# Patient Record
Sex: Female | Born: 2000
Health system: Southern US, Community
[De-identification: ages and names within clinical notes are randomized; demographics above are authoritative.]

## PROBLEM LIST (undated history)

## (undated) ENCOUNTER — Inpatient Hospital Stay (HOSPITAL_COMMUNITY): Payer: Self-pay

## (undated) DIAGNOSIS — Z789 Other specified health status: Secondary | ICD-10-CM

## (undated) DIAGNOSIS — N62 Hypertrophy of breast: Secondary | ICD-10-CM

## (undated) HISTORY — PX: WISDOM TOOTH EXTRACTION: SHX21

---
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
## 2018-01-02 MED FILL — FEMYNOR 0.25-35 MG-MCG TABS: 0.25-35 | 84 days supply | Qty: 84 | Fill #2

## 2018-03-20 MED FILL — FEMYNOR 0.25-35 MG-MCG TABS: 0.25-35 | 84 days supply | Qty: 84 | Fill #0

## 2018-04-15 LAB — HM PAP SMEAR

## 2018-04-21 DIAGNOSIS — E669 Obesity, unspecified: Secondary | ICD-10-CM | POA: Diagnosis not present

## 2018-04-21 DIAGNOSIS — Z23 Encounter for immunization: Secondary | ICD-10-CM | POA: Diagnosis not present

## 2018-04-21 DIAGNOSIS — Z113 Encounter for screening for infections with a predominantly sexual mode of transmission: Secondary | ICD-10-CM | POA: Diagnosis not present

## 2018-04-21 DIAGNOSIS — Z3041 Encounter for surveillance of contraceptive pills: Secondary | ICD-10-CM | POA: Diagnosis not present

## 2018-04-21 DIAGNOSIS — Z01419 Encounter for gynecological examination (general) (routine) without abnormal findings: Secondary | ICD-10-CM | POA: Diagnosis not present

## 2018-04-21 DIAGNOSIS — R635 Abnormal weight gain: Secondary | ICD-10-CM | POA: Diagnosis not present

## 2018-04-21 DIAGNOSIS — Z68.41 Body mass index (BMI) pediatric, greater than or equal to 95th percentile for age: Secondary | ICD-10-CM | POA: Diagnosis not present

## 2018-04-21 DIAGNOSIS — N6320 Unspecified lump in the left breast, unspecified quadrant: Secondary | ICD-10-CM | POA: Diagnosis not present

## 2018-04-22 ENCOUNTER — Other Ambulatory Visit: Payer: Self-pay | Admitting: Obstetrics and Gynecology

## 2018-04-22 DIAGNOSIS — N6320 Unspecified lump in the left breast, unspecified quadrant: Secondary | ICD-10-CM

## 2018-05-01 ENCOUNTER — Other Ambulatory Visit: Payer: Self-pay | Admitting: Obstetrics and Gynecology

## 2018-05-01 ENCOUNTER — Ambulatory Visit
Admission: RE | Admit: 2018-05-01 | Discharge: 2018-05-01 | Disposition: A | Discharge status: Home / Self Care | Payer: 59 | Source: Ambulatory Visit | Attending: Obstetrics and Gynecology | Admitting: Obstetrics and Gynecology

## 2018-05-01 DIAGNOSIS — N6320 Unspecified lump in the left breast, unspecified quadrant: Secondary | ICD-10-CM

## 2018-05-01 DIAGNOSIS — N6324 Unspecified lump in the left breast, lower inner quadrant: Secondary | ICD-10-CM | POA: Diagnosis not present

## 2018-06-23 DIAGNOSIS — Z23 Encounter for immunization: Secondary | ICD-10-CM | POA: Diagnosis not present

## 2018-06-23 MED FILL — FEMYNOR 0.25-35 MG-MCG TABS: 0.25-35 | 84 days supply | Qty: 84 | Fill #0

## 2018-09-07 MED FILL — FEMYNOR 0.25-35 MG-MCG TABS: 0.25-35 | 84 days supply | Qty: 84 | Fill #1

## 2018-09-18 ENCOUNTER — Telehealth: Payer: Self-pay | Admitting: General Practice

## 2018-09-18 NOTE — Telephone Encounter (Signed)
Yes, I will see her 

## 2018-09-18 NOTE — Telephone Encounter (Signed)
Appointment has been made for 12/14/18 & new patient paperwork has been mailed.

## 2018-09-18 NOTE — Telephone Encounter (Signed)
Copied from [place] [id number]. Topic: General - Other >> Sep 18, 2018 10:03 AM Sheran Luz wrote: Reason for CRM: Carmelina Noun, pts mother, called inquiring if Dr. Ronnald Ramp would accept Amica as a new patient. Charlena Cross and her son Emiliana Blaize are current patients of Dr. Ronnald Ramp. Charlena Cross is requesting call back.   Please advise, Thank you.

## 2018-10-23 DIAGNOSIS — Z23 Encounter for immunization: Secondary | ICD-10-CM | POA: Diagnosis not present

## 2018-11-02 ENCOUNTER — Other Ambulatory Visit: Payer: 59

## 2018-11-30 MED FILL — FEMYNOR 0.25-35 MG-MCG TABS: 0.25-35 | 84 days supply | Qty: 84 | Fill #2

## 2018-12-14 ENCOUNTER — Other Ambulatory Visit (INDEPENDENT_AMBULATORY_CARE_PROVIDER_SITE_OTHER): Payer: No Typology Code available for payment source

## 2018-12-14 ENCOUNTER — Encounter (INDEPENDENT_AMBULATORY_CARE_PROVIDER_SITE_OTHER): Payer: Self-pay

## 2018-12-14 ENCOUNTER — Encounter: Payer: Self-pay | Admitting: Internal Medicine

## 2018-12-14 ENCOUNTER — Ambulatory Visit (INDEPENDENT_AMBULATORY_CARE_PROVIDER_SITE_OTHER): Payer: No Typology Code available for payment source | Admitting: Internal Medicine

## 2018-12-14 VITALS — BP 110/70 | HR 85 | Temp 98.0°F | Resp 16 | Ht 64.0 in | Wt 185.5 lb

## 2018-12-14 DIAGNOSIS — N62 Hypertrophy of breast: Secondary | ICD-10-CM | POA: Diagnosis not present

## 2018-12-14 DIAGNOSIS — E669 Obesity, unspecified: Secondary | ICD-10-CM

## 2018-12-14 DIAGNOSIS — Z23 Encounter for immunization: Secondary | ICD-10-CM

## 2018-12-14 DIAGNOSIS — Z113 Encounter for screening for infections with a predominantly sexual mode of transmission: Secondary | ICD-10-CM

## 2018-12-14 DIAGNOSIS — Z0000 Encounter for general adult medical examination without abnormal findings: Secondary | ICD-10-CM

## 2018-12-14 LAB — CBC WITH DIFFERENTIAL/PLATELET
Basophils Absolute: 0 10*3/uL (ref 0.0–0.1)
Basophils Relative: 0.2 % (ref 0.0–3.0)
Eosinophils Absolute: 0.1 10*3/uL (ref 0.0–0.7)
Eosinophils Relative: 1.3 % (ref 0.0–5.0)
HCT: 36.2 % (ref 36.0–49.0)
Hemoglobin: 12.2 g/dL (ref 12.0–16.0)
Lymphocytes Relative: 23.7 % — ABNORMAL LOW (ref 24.0–48.0)
Lymphs Abs: 2.1 10*3/uL (ref 0.7–4.0)
MCHC: 33.7 g/dL (ref 31.0–37.0)
MCV: 85.6 fl (ref 78.0–98.0)
Monocytes Absolute: 1.1 10*3/uL — ABNORMAL HIGH (ref 0.1–1.0)
Monocytes Relative: 12.3 % — ABNORMAL HIGH (ref 3.0–12.0)
Neutro Abs: 5.6 10*3/uL (ref 1.4–7.7)
Neutrophils Relative %: 62.5 % (ref 43.0–71.0)
Platelets: 329 10*3/uL (ref 150.0–575.0)
RBC: 4.24 Mil/uL (ref 3.80–5.70)
RDW: 13.7 % (ref 11.4–15.5)
WBC: 8.9 10*3/uL (ref 4.5–13.5)

## 2018-12-14 LAB — COMPREHENSIVE METABOLIC PANEL
ALT: 13 U/L (ref 0–35)
AST: 14 U/L (ref 0–37)
Albumin: 3.9 g/dL (ref 3.5–5.2)
Alkaline Phosphatase: 71 U/L (ref 47–119)
BUN: 11 mg/dL (ref 6–23)
CO2: 26 mEq/L (ref 19–32)
Calcium: 9.8 mg/dL (ref 8.4–10.5)
Chloride: 104 mEq/L (ref 96–112)
Creatinine, Ser: 0.79 mg/dL (ref 0.40–1.20)
GFR: 119.98 mL/min (ref >60.00)
Glucose, Bld: 77 mg/dL (ref 70–99)
Potassium: 4.1 mEq/L (ref 3.5–5.1)
Sodium: 137 mEq/L (ref 135–145)
Total Bilirubin: 0.3 mg/dL (ref 0.2–1.2)
Total Protein: 7.3 g/dL (ref 6.0–8.3)

## 2018-12-14 LAB — LIPID PANEL
Cholesterol: 212 mg/dL — ABNORMAL HIGH (ref 0–200)
HDL: 49.4 mg/dL (ref >39.00)
LDL Cholesterol: 149 mg/dL — ABNORMAL HIGH (ref 0–99)
NonHDL: 162.95
Total CHOL/HDL Ratio: 4
Triglycerides: 69 mg/dL (ref 0.0–149.0)
VLDL: 13.8 mg/dL (ref 0.0–40.0)

## 2018-12-14 LAB — HEMOGLOBIN A1C: Hgb A1c MFr Bld: 5.9 % (ref 4.6–6.5)

## 2018-12-14 LAB — TSH: TSH: 0.58 u[IU]/mL (ref 0.40–5.00)

## 2018-12-14 NOTE — Patient Instructions (Signed)
Preventive Care 18-39 Years, Female Preventive care refers to lifestyle choices and visits with your health care provider that can promote health and wellness. What does preventive care include?   A yearly physical exam. This is also called an annual well check.  Dental exams once or twice a year.  Routine eye exams. Ask your health care provider how often you should have your eyes checked.  Personal lifestyle choices, including: ? Daily care of your teeth and gums. ? Regular physical activity. ? Eating a healthy diet. ? Avoiding tobacco and drug use. ? Limiting alcohol use. ? Practicing safe sex. ? Taking vitamin and mineral supplements as recommended by your health care provider. What happens during an annual well check? The services and screenings done by your health care provider during your annual well check will depend on your age, overall health, lifestyle risk factors, and family history of disease. Counseling Your health care provider may ask you questions about your:  Alcohol use.  Tobacco use.  Drug use.  Emotional well-being.  Home and relationship well-being.  Sexual activity.  Eating habits.  Work and work environment.  Method of birth control.  Menstrual cycle.  Pregnancy history. Screening You may have the following tests or measurements:  Height, weight, and BMI.  Diabetes screening. This is done by checking your blood sugar (glucose) after you have not eaten for a while (fasting).  Blood pressure.  Lipid and cholesterol levels. These may be checked every 5 years starting at age 20.  Skin check.  Hepatitis C blood test.  Hepatitis B blood test.  Sexually transmitted disease (STD) testing.  BRCA-related cancer screening. This may be done if you have a family history of breast, ovarian, tubal, or peritoneal cancers.  Pelvic exam and Pap test. This may be done every 3 years starting at age 21. Starting at age 30, this may be done every 5  years if you have a Pap test in combination with an HPV test. Discuss your test results, treatment options, and if necessary, the need for more tests with your health care provider. Vaccines Your health care provider may recommend certain vaccines, such as:  Influenza vaccine. This is recommended every year.  Tetanus, diphtheria, and acellular pertussis (Tdap, Td) vaccine. You may need a Td booster every 10 years.  Varicella vaccine. You may need this if you have not been vaccinated.  HPV vaccine. If you are 26 or younger, you may need three doses over 6 months.  Measles, mumps, and rubella (MMR) vaccine. You may need at least one dose of MMR. You may also need a second dose.  Pneumococcal 13-valent conjugate (PCV13) vaccine. You may need this if you have certain conditions and were not previously vaccinated.  Pneumococcal polysaccharide (PPSV23) vaccine. You may need one or two doses if you smoke cigarettes or if you have certain conditions.  Meningococcal vaccine. One dose is recommended if you are age 19-21 years and a first-year college student living in a residence hall, or if you have one of several medical conditions. You may also need additional booster doses.  Hepatitis A vaccine. You may need this if you have certain conditions or if you travel or work in places where you may be exposed to hepatitis A.  Hepatitis B vaccine. You may need this if you have certain conditions or if you travel or work in places where you may be exposed to hepatitis B.  Haemophilus influenzae type b (Hib) vaccine. You may need this if you   have certain risk factors. Talk to your health care provider about which screenings and vaccines you need and how often you need them. This information is not intended to replace advice given to you by your health care provider. Make sure you discuss any questions you have with your health care provider. Document Released: 01/14/2002 Document Revised: 07/01/2017  Document Reviewed: 09/19/2015 Elsevier Interactive Patient Education  2019 [employer].

## 2018-12-14 NOTE — Progress Notes (Signed)
Subjective:  Patient ID: Gabriella Phillips, female    DOB: 10-10-2001  Age: 18 y.o. MRN: [id number]  CC: Annual Exam   HPI [name] presents for a CPX.  She complains that her breasts are too large and feel uncomfortable.  She wants to see a plastic surgeon to consider breast reduction surgery.  She is sexually active and uses an oral contraception to prevent pregnancy and condoms to prevent STDs.  She otherwise feels well today and offers no complaints.  History [place] has no past medical history on file.   She has no past surgical history on file.   Her family history is not on file.She reports that she has never smoked. She has never used smokeless tobacco. She reports that she does not drink alcohol or use drugs.  Outpatient Medications Prior to Visit  Medication Sig Dispense Refill  . norgestimate-ethinyl estradiol (ORTHO-CYCLEN,SPRINTEC,PREVIFEM) 0.25-35 MG-MCG tablet Take 1 tablet by mouth daily.     No facility-administered medications prior to visit.     ROS Review of Systems  Constitutional: Negative.  Negative for diaphoresis and fatigue.  HENT: Negative.   Eyes: Negative.  Negative for visual disturbance.  Respiratory: Negative for apnea, cough, chest tightness, shortness of breath and wheezing.   Cardiovascular: Negative for chest pain, palpitations and leg swelling.  Gastrointestinal: Negative for abdominal pain, constipation, diarrhea, nausea and vomiting.  Endocrine: Negative.   Genitourinary: Negative.   Musculoskeletal: Negative.  Negative for arthralgias, back pain, myalgias and neck pain.  Skin: Negative.  Negative for color change.  Neurological: Negative.  Negative for dizziness, weakness, light-headedness and headaches.  Hematological: Negative for adenopathy. Does not bruise/bleed easily.  Psychiatric/Behavioral: Negative.     Objective:  BP 110/70 (BP Location: Left Arm, Patient Position: Sitting, Cuff Size: Normal)   Pulse 85   Temp 98  F (36.7 C) (Oral)   Resp 16   Ht 5\' 4"  (1.626 m)   Wt 185 lb 8 oz (84.1 kg)   LMP 12/10/2018   SpO2 98%   BMI 31.84 kg/m   Physical Exam Vitals signs reviewed.  Constitutional:      Appearance: She is obese. She is not ill-appearing.  HENT:     Nose: Nose normal. No congestion or rhinorrhea.     Mouth/Throat:     Mouth: Mucous membranes are moist.     Pharynx: Oropharynx is clear. No oropharyngeal exudate or posterior oropharyngeal erythema.  Eyes:     General: No scleral icterus.    Conjunctiva/sclera: Conjunctivae normal.  Neck:     Musculoskeletal: Normal range of motion and neck supple. No muscular tenderness.  Cardiovascular:     Rate and Rhythm: Normal rate and regular rhythm.     Heart sounds: No murmur. No friction rub. No gallop.   Pulmonary:     Effort: Pulmonary effort is normal.     Breath sounds: Normal breath sounds. No wheezing, rhonchi or rales.  Abdominal:     General: Bowel sounds are normal.     Palpations: There is no mass.     Tenderness: There is no abdominal tenderness. There is no guarding.  Genitourinary:    Comments: She deferred on a GU or Pap exam today because another physician did this in May 2019. Musculoskeletal: Normal range of motion.        General: No swelling.     Right lower leg: No edema.     Left lower leg: No edema.  Lymphadenopathy:  Cervical: No cervical adenopathy.  Skin:    General: Skin is warm and dry.  Neurological:     General: No focal deficit present.     Mental Status: She is oriented to person, place, and time. Mental status is at baseline.  Psychiatric:        Mood and Affect: Mood normal.        Behavior: Behavior normal.        Thought Content: Thought content normal.        Judgment: Judgment normal.     Lab Results  Component Value Date   WBC 8.9 12/14/2018   HGB 12.2 12/14/2018   HCT 36.2 12/14/2018   PLT 329.0 12/14/2018   GLUCOSE 77 12/14/2018   CHOL 212 (H) 12/14/2018   TRIG 69.0  12/14/2018   HDL 49.40 12/14/2018   LDLCALC 149 (H) 12/14/2018   ALT 13 12/14/2018   AST 14 12/14/2018   NA 137 12/14/2018   K 4.1 12/14/2018   CL 104 12/14/2018   CREATININE 0.79 12/14/2018   BUN 11 12/14/2018   CO2 26 12/14/2018   TSH 0.58 12/14/2018   HGBA1C 5.9 12/14/2018     Assessment & Plan:   Stephnie was seen today for annual exam.  Diagnoses and all orders for this visit:  Breast hypertrophy in female -     Ambulatory referral to Plastic Surgery  Routine general medical examination at a health care facility- Exam completed, labs reviewed, vaccines reviewed and updated, patient education material was given. -     Lipid panel; Future  Routine screening for STI (sexually transmitted infection) -     Cancel: GC/Chlamydia Probe Amp([facility]) -     Cancel: HIV antibody (with reflex) -     HIV antibody (with reflex); Future -     GC/Chlamydia Probe Amp([facility]); Future  Obesity (BMI 30.0-34.9)- Her BMI is at 32.  Her labs are negative for secondary causes or complications.  She was encouraged to improve her lifestyle modifications. -     CBC with Differential/Platelet; Future -     Hemoglobin A1c; Future -     TSH; Future -     Comprehensive metabolic panel; Future  Need for Tdap vaccination -     Tdap vaccine greater than or equal to 7yo IM  Need for influenza vaccination -     Flu Vaccine QUAD 36+ mos IM   I am having Jinx K. Persichetti maintain her norgestimate-ethinyl estradiol.  No orders of the defined types were placed in this encounter.    Follow-up: Return if symptoms worsen or fail to improve.  Scarlette Calico, MD

## 2018-12-15 LAB — HIV ANTIBODY (ROUTINE TESTING W REFLEX): HIV 1&2 Ab, 4th Generation: NONREACTIVE

## 2018-12-16 ENCOUNTER — Encounter: Payer: Self-pay | Admitting: Internal Medicine

## 2018-12-16 LAB — GC/CHLAMYDIA PROBE AMP
Chlamydia trachomatis, NAA: NEGATIVE
Neisseria gonorrhoeae by PCR: NEGATIVE

## 2019-01-01 ENCOUNTER — Encounter: Payer: Self-pay | Admitting: Internal Medicine

## 2019-01-01 NOTE — Telephone Encounter (Signed)
Pt is requesting the referral for plastic surgery to go to Dr Theodoro Kos [place].   Can this be done or do we need to re enter the referral.

## 2019-01-04 NOTE — Telephone Encounter (Signed)
No need for new referral. I have left a vm at her office to call me back with referral process

## 2019-01-12 ENCOUNTER — Encounter: Payer: Self-pay | Admitting: Plastic Surgery

## 2019-01-12 ENCOUNTER — Ambulatory Visit: Payer: No Typology Code available for payment source | Admitting: Plastic Surgery

## 2019-01-12 VITALS — BP 123/75 | HR 81 | Temp 99.0°F | Ht 64.0 in | Wt 185.0 lb

## 2019-01-12 DIAGNOSIS — G8929 Other chronic pain: Secondary | ICD-10-CM

## 2019-01-12 DIAGNOSIS — M546 Pain in thoracic spine: Secondary | ICD-10-CM

## 2019-01-12 DIAGNOSIS — M542 Cervicalgia: Secondary | ICD-10-CM

## 2019-01-12 DIAGNOSIS — M549 Dorsalgia, unspecified: Secondary | ICD-10-CM | POA: Insufficient documentation

## 2019-01-12 DIAGNOSIS — N62 Hypertrophy of breast: Secondary | ICD-10-CM

## 2019-01-12 NOTE — Progress Notes (Signed)
     Patient ID: Gabriella Phillips, female    DOB: [birth year], 18 y.o.   MRN: [id number]   Chief Complaint  Patient presents with  . Advice Only    for breast reduction    Mammary Hyperplasia: The patient is a 18 y.o. female with a history of mammary hyperplasia for several years.  She has extremely large breasts causing symptoms that include the following: Back pain (upper and lower) and neck pain. She frequently pins bra cups higher on straps for better lift and relief. Notices relief when holding breast up in her hands. Shoulder straps causing grooves, pain occasionally requiring padding. Pain medication is sometimes required with motrin and tylenol.  Activities that are hindered by enlarged breasts include: mammogram May 2019 - fibrosis but otherwise negative.  Her breasts are extremely large and fairly symmetric.  She has hyperpigmentation of the inframammary area on both sides.  The sternal to nipple distance on the right is 34 cm and the left is 34 cm.  The IMF distance is 16 cm.  She is 5 feet 4 inches tall and weighs 185 pounds.  Preoperative bra size = 38 DDD cup.  The estimated excess breast tissue to be removed at the time of surgery = 650 grams on the left and 650 grams on the right.     Review of Systems  Constitutional: Positive for activity change. Negative for appetite change.  HENT: Negative.   Eyes: Negative.   Respiratory: Negative.   Gastrointestinal: Negative.   Endocrine: Negative.   Genitourinary: Negative.   Musculoskeletal: Positive for back pain and neck pain.  Skin: Negative for color change and wound.  Neurological: Negative.   Psychiatric/Behavioral: Negative.     History reviewed. No pertinent past medical history.  History reviewed. No pertinent surgical history.    Current Outpatient Medications:  .  norgestimate-ethinyl estradiol (ORTHO-CYCLEN,SPRINTEC,PREVIFEM) 0.25-35 MG-MCG tablet, Take 1 tablet by mouth daily., Disp: , Rfl:    Objective:    Vitals:   01/12/19 1109  BP: 123/75  Pulse: 81  Temp: 99 F (37.2 C)  SpO2: 100%    Physical Exam Vitals signs and nursing note reviewed.  Constitutional:      Appearance: Normal appearance.  HENT:     Head: Normocephalic and atraumatic.     Nose: Nose normal.     Mouth/Throat:     Mouth: Mucous membranes are moist.  Eyes:     Extraocular Movements: Extraocular movements intact.  Cardiovascular:     Rate and Rhythm: Normal rate.  Pulmonary:     Effort: Pulmonary effort is normal. No respiratory distress.     Breath sounds: No wheezing.  Abdominal:     General: Abdomen is flat. There is no distension.     Tenderness: There is no abdominal tenderness.  Neurological:     General: No focal deficit present.     Mental Status: She is alert.  Psychiatric:        Mood and Affect: Mood normal.        Thought Content: Thought content normal.        Judgment: Judgment normal.     Assessment & Plan:  Breast hypertrophy in female  Chronic bilateral thoracic back pain  Neck pain  Recommend bilateral breast reduction for removal of excess weight from shoulders and back.  [place], DO

## 2019-02-03 ENCOUNTER — Ambulatory Visit (INDEPENDENT_AMBULATORY_CARE_PROVIDER_SITE_OTHER): Payer: No Typology Code available for payment source | Admitting: Internal Medicine

## 2019-02-03 ENCOUNTER — Encounter: Payer: Self-pay | Admitting: Internal Medicine

## 2019-02-03 VITALS — BP 120/70 | HR 80 | Temp 99.0°F | Ht 64.0 in | Wt 184.0 lb

## 2019-02-03 DIAGNOSIS — M546 Pain in thoracic spine: Secondary | ICD-10-CM

## 2019-02-03 DIAGNOSIS — G8929 Other chronic pain: Secondary | ICD-10-CM

## 2019-02-03 NOTE — Progress Notes (Signed)
Subjective:  Patient ID: Gabriella Phillips, female    DOB: August 29, 2001  Age: 18 y.o. MRN: [id number]  CC: Follow-up   HPI Iylah K Sol presents for f/up - She scheduled this follow-up because she thought she needed a letter to get plastic surgery approved because she suffers from chronic back pain related to large breasts.  Since scheduling this appointment she tells me that she has been approved for the surgery and no longer needs a letter from me.  Therefore, she was not seen today and no charge will be entered.  Outpatient Medications Prior to Visit  Medication Sig Dispense Refill  . norgestimate-ethinyl estradiol (ORTHO-CYCLEN,SPRINTEC,PREVIFEM) 0.25-35 MG-MCG tablet Take 1 tablet by mouth daily.     No facility-administered medications prior to visit.     ROS Review of Systems  Objective:  BP 120/70 (BP Location: Left Arm, Patient Position: Sitting, Cuff Size: Normal)   Pulse 80   Temp 99 F (37.2 C) (Oral)   Ht 5\' 4"  (1.626 m)   Wt 184 lb (83.5 kg)   LMP 01/06/2019 (Approximate)   SpO2 99%   BMI 31.58 kg/m   BP Readings from Last 3 Encounters:  02/03/19 120/70  01/12/19 123/75  12/14/18 110/70    Wt Readings from Last 3 Encounters:  02/03/19 184 lb (83.5 kg) (95 %, Z= 1.68)*  01/12/19 185 lb (83.9 kg) (96 %, Z= 1.70)*  12/14/18 185 lb 8 oz (84.1 kg) (96 %, Z= 1.71)*   * Growth percentiles are based on CDC (Girls, 2-20 Years) data.    Physical Exam  Lab Results  Component Value Date   WBC 8.9 12/14/2018   HGB 12.2 12/14/2018   HCT 36.2 12/14/2018   PLT 329.0 12/14/2018   GLUCOSE 77 12/14/2018   CHOL 212 (H) 12/14/2018   TRIG 69.0 12/14/2018   HDL 49.40 12/14/2018   LDLCALC 149 (H) 12/14/2018   ALT 13 12/14/2018   AST 14 12/14/2018   NA 137 12/14/2018   K 4.1 12/14/2018   CL 104 12/14/2018   CREATININE 0.79 12/14/2018   BUN 11 12/14/2018   CO2 26 12/14/2018   TSH 0.58 12/14/2018   HGBA1C 5.9 12/14/2018    US Breast Ltd Uni Left Inc  Axilla  Result Date: 05/01/2018 CLINICAL DATA:  18 year old patient has palpated a lump in the 7 o'clock region of the left breast for approximately 6-7 months. EXAM: ULTRASOUND OF THE LEFT BREAST COMPARISON:  None FINDINGS: On physical exam, there is a smooth, oval, and mobile mass measuring approximately 3.5 cm at 7 o'clock position 2 cm from the nipple. Targeted ultrasound is performed, showing a homogeneously hypoechoic circumscribed oval mass parallel to the chest wall 7 o'clock position 2 cm from the nipple. This mass measures 4.1 x 2.4 x 4.4 cm. Superficial aspect of the mass abuts the dermis. IMPRESSION: Probable fibroadenoma in the left breast 7 o'clock position. RECOMMENDATION: The options of follow-up left breast ultrasounds in 6, 12, and 24 months, versus surgical consultation for possible excision, versus ultrasound-guided core needle biopsy were discussed with the patient. At this point, she prefers imaging follow-up. Left breast ultrasound is recommended in 6 months. I have discussed the findings and recommendations with the patient. Results were also provided in writing at the conclusion of the visit. If applicable, a reminder letter will be sent to the patient regarding the next appointment. BI-RADS CATEGORY  3: Probably benign. Electronically Signed   By: Curlene Dolphin M.D.   On: 05/01/2018 07:49  Assessment & Plan:   There are no diagnoses linked to this encounter. I am having Akeila K. Friley maintain her norgestimate-ethinyl estradiol.  No orders of the defined types were placed in this encounter.    Follow-up: No follow-ups on file.  Scarlette Calico, MD

## 2019-02-17 ENCOUNTER — Telehealth: Payer: Self-pay

## 2019-02-17 DIAGNOSIS — N6320 Unspecified lump in the left breast, unspecified quadrant: Secondary | ICD-10-CM

## 2019-02-17 MED FILL — FEMYNOR 0.25-35 MG-MCG TABS: 0.25-35 | 84 days supply | Qty: 84 | Fill #3

## 2019-02-17 NOTE — Telephone Encounter (Signed)
Per Dr. Eusebio Friendly order- pt will need to have a breast ultrasound prior to surgery on 04/14/2019 Call to pt to confirm that she would like to return to [facility] for this study. I will forward this order to them  [facility]

## 2019-02-17 NOTE — Telephone Encounter (Signed)
-----   Message from [place] sent at 02/10/2019  2:43 PM EDT ----- Regarding: breast ultrasound Can you please put an order in for a breast ultrasound, if a current one is needed. I only see the [place] from 2019. Thank you!

## 2019-03-03 ENCOUNTER — Other Ambulatory Visit: Payer: Self-pay

## 2019-03-03 ENCOUNTER — Other Ambulatory Visit: Payer: Self-pay | Admitting: Plastic Surgery

## 2019-03-03 ENCOUNTER — Ambulatory Visit
Admission: RE | Admit: 2019-03-03 | Discharge: 2019-03-03 | Disposition: A | Discharge status: Home / Self Care | Payer: No Typology Code available for payment source | Source: Ambulatory Visit | Attending: Plastic Surgery | Admitting: Plastic Surgery

## 2019-03-03 DIAGNOSIS — N6320 Unspecified lump in the left breast, unspecified quadrant: Secondary | ICD-10-CM

## 2019-03-04 ENCOUNTER — Ambulatory Visit
Admission: RE | Admit: 2019-03-04 | Discharge: 2019-03-04 | Disposition: A | Discharge status: Home / Self Care | Payer: No Typology Code available for payment source | Source: Ambulatory Visit | Attending: Plastic Surgery | Admitting: Plastic Surgery

## 2019-03-04 DIAGNOSIS — N6320 Unspecified lump in the left breast, unspecified quadrant: Secondary | ICD-10-CM

## 2019-03-30 ENCOUNTER — Encounter: Payer: No Typology Code available for payment source | Admitting: Plastic Surgery

## 2019-04-04 ENCOUNTER — Encounter: Payer: Self-pay | Admitting: Internal Medicine

## 2019-04-23 ENCOUNTER — Encounter: Payer: No Typology Code available for payment source | Admitting: Plastic Surgery

## 2019-04-30 ENCOUNTER — Telehealth: Payer: Self-pay | Admitting: Plastic Surgery

## 2019-04-30 NOTE — Telephone Encounter (Signed)
Called patient to confirm appointment scheduled for Monday. Patient answered the following questions: 1. To the best of your knowledge, have you been in close contact with any one with a confirmed diagnosis of COVID-19? No 2. Have you had any one or more of the following; fever, chills, cough, shortness of breath, or any flu-like symptoms? No 3. Have you been diagnosed with or have a previous diagnosis of COVID 19? No 4. I am going to go over a few other symptoms with you. Please let me know if you are experiencing any of the following: None of the below a. Ear, nose, or throat discomfort b. A sore throat c. Headache d. Muscle pain e. Diarrhea f. Loss of taste or smell

## 2019-05-03 ENCOUNTER — Other Ambulatory Visit: Payer: Self-pay

## 2019-05-03 ENCOUNTER — Encounter: Payer: Self-pay | Admitting: Plastic Surgery

## 2019-05-03 ENCOUNTER — Ambulatory Visit (INDEPENDENT_AMBULATORY_CARE_PROVIDER_SITE_OTHER): Payer: No Typology Code available for payment source | Admitting: Plastic Surgery

## 2019-05-03 VITALS — BP 119/76 | HR 83 | Temp 98.7°F | Ht 65.0 in | Wt 190.4 lb

## 2019-05-03 DIAGNOSIS — M542 Cervicalgia: Secondary | ICD-10-CM

## 2019-05-03 DIAGNOSIS — M546 Pain in thoracic spine: Secondary | ICD-10-CM

## 2019-05-03 DIAGNOSIS — N62 Hypertrophy of breast: Secondary | ICD-10-CM

## 2019-05-03 DIAGNOSIS — G8929 Other chronic pain: Secondary | ICD-10-CM

## 2019-05-03 DIAGNOSIS — E669 Obesity, unspecified: Secondary | ICD-10-CM

## 2019-05-03 MED ORDER — CEPHALEXIN 500 MG PO CAPS: 500.0000 mg | ORAL_CAPSULE | Freq: Four times a day (QID) | ORAL | 0 refills | Status: AC

## 2019-05-03 MED ORDER — HYDROCODONE-ACETAMINOPHEN 5-325 MG PO TABS: 1.0000 | ORAL_TABLET | Freq: Three times a day (TID) | ORAL | 0 refills | Status: AC | PRN

## 2019-05-03 MED ORDER — ONDANSETRON HCL 4 MG PO TABS: 4.0000 mg | ORAL_TABLET | Freq: Three times a day (TID) | ORAL | 0 refills | Status: AC | PRN

## 2019-05-03 MED FILL — HYDROCODON-APAP 5-325: 5-325 | 7 days supply | Qty: 20 | Fill #0

## 2019-05-03 MED FILL — CEPHALEXIN 500 MG CAPSULE: 500 | 5 days supply | Qty: 20 | Fill #0

## 2019-05-03 MED FILL — ONDANSETRON HCL 4 MG TABLET: 4 | 5 days supply | Qty: 15 | Fill #0

## 2019-05-03 NOTE — Progress Notes (Signed)
Patient ID: Gabriella Phillips, female    DOB: March 13, 2001, 18 y.o.   MRN: [id number]   Chief Complaint  Patient presents with  . Breast Problem    The patient is a 18 year old female here for her preoperative history and physical for bilateral breast reduction.  She has complaints of neck and back pain.  She is a 38 DDD bra size.  She is 5 feet 5 inches tall and weighs 190 pounds.  She has not had any recent illnesses. She has been staying at home with the Covid-19 crisis. We have estimated a 650 gm removal from each side.   Review of Systems  Constitutional: Negative.  Negative for activity change and appetite change.  HENT: Negative.   Eyes: Negative.   Respiratory: Negative.  Negative for chest tightness and shortness of breath.   Cardiovascular: Negative.  Negative for leg swelling.  Gastrointestinal: Negative.  Negative for abdominal distention and abdominal pain.  Endocrine: Negative.   Genitourinary: Negative.   Musculoskeletal: Positive for back pain and neck pain.  Skin: Negative.   Psychiatric/Behavioral: Negative.     History reviewed. No pertinent past medical history.  History reviewed. No pertinent surgical history.    Current Outpatient Medications:  .  norgestimate-ethinyl estradiol (ORTHO-CYCLEN,SPRINTEC,PREVIFEM) 0.25-35 MG-MCG tablet, Take 1 tablet by mouth daily., Disp: , Rfl:    Objective:   Vitals:   05/03/19 0834  BP: 119/76  Pulse: 83  Temp: 98.7 F (37.1 C)  SpO2: 97%    Physical Exam Vitals signs and nursing note reviewed.  Constitutional:      Appearance: Normal appearance.  HENT:     Head: Normocephalic and atraumatic.  Neck:     Musculoskeletal: Normal range of motion.  Cardiovascular:     Rate and Rhythm: Normal rate.     Pulses: Normal pulses.  Pulmonary:     Effort: Pulmonary effort is normal. No respiratory distress.     Breath sounds: No wheezing.  Abdominal:     General: Abdomen is flat. There is no distension.   Tenderness: There is no abdominal tenderness.  Musculoskeletal:        General: No swelling.  Skin:    General: Skin is warm.  Neurological:     General: No focal deficit present.     Mental Status: She is alert and oriented to person, place, and time.  Psychiatric:        Mood and Affect: Mood normal.        Behavior: Behavior normal.     Assessment & Plan:  Neck pain  Obesity (BMI 30.0-34.9)  Chronic bilateral thoracic back pain  Symptomatic mammary hypertrophy Plan for bilateral breast reduction.  The risk that can be encountered with breast reduction were discussed and include the following but not limited to these:  Breast asymmetry, fluid accumulation, firmness of the breast, inability to breast feed, loss of nipple or areola, skin loss, decrease or no nipple sensation, fat necrosis of the breast tissue, bleeding, infection, healing delay.  There are risks of anesthesia, changes to skin sensation and injury to nerves or blood vessels.  The muscle can be temporarily or permanently injured.  You may have an allergic reaction to tape, suture, glue, blood products which can result in skin discoloration, swelling, pain, skin lesions, poor healing.  Any of these can lead to the need for revisonal surgery or stage procedures.  A reduction has potential to interfere with diagnostic procedures.  Nipple or breast piercing can  increase risks of infection.  This procedure is best done when the breast is fully developed.  Changes in the breast will continue to occur over time.  Pregnancy can alter the outcomes of previous breast reduction surgery, weight gain and weigh loss can also effect the long term appearance.   Prescriptions sent to pharmacy.  [place], DO

## 2019-05-03 NOTE — H&P (View-Only) (Signed)
Patient ID: Gabriella Phillips, female    DOB: 04/02/2001, 18 y.o.   MRN: [id number]   Chief Complaint  Patient presents with  . Breast Problem    The patient is a 18 year old female here for her preoperative history and physical for bilateral breast reduction.  She has complaints of neck and back pain.  She is a 38 DDD bra size.  She is 5 feet 5 inches tall and weighs 190 pounds.  She has not had any recent illnesses. She has been staying at home with the Covid-19 crisis. We have estimated a 650 gm removal from each side.   Review of Systems  Constitutional: Negative.  Negative for activity change and appetite change.  HENT: Negative.   Eyes: Negative.   Respiratory: Negative.  Negative for chest tightness and shortness of breath.   Cardiovascular: Negative.  Negative for leg swelling.  Gastrointestinal: Negative.  Negative for abdominal distention and abdominal pain.  Endocrine: Negative.   Genitourinary: Negative.   Musculoskeletal: Positive for back pain and neck pain.  Skin: Negative.   Psychiatric/Behavioral: Negative.     History reviewed. No pertinent past medical history.  History reviewed. No pertinent surgical history.    Current Outpatient Medications:  .  norgestimate-ethinyl estradiol (ORTHO-CYCLEN,SPRINTEC,PREVIFEM) 0.25-35 MG-MCG tablet, Take 1 tablet by mouth daily., Disp: , Rfl:    Objective:   Vitals:   05/03/19 0834  BP: 119/76  Pulse: 83  Temp: 98.7 F (37.1 C)  SpO2: 97%    Physical Exam Vitals signs and nursing note reviewed.  Constitutional:      Appearance: Normal appearance.  HENT:     Head: Normocephalic and atraumatic.  Neck:     Musculoskeletal: Normal range of motion.  Cardiovascular:     Rate and Rhythm: Normal rate.     Pulses: Normal pulses.  Pulmonary:     Effort: Pulmonary effort is normal. No respiratory distress.     Breath sounds: No wheezing.  Abdominal:     General: Abdomen is flat. There is no distension.   Tenderness: There is no abdominal tenderness.  Musculoskeletal:        General: No swelling.  Skin:    General: Skin is warm.  Neurological:     General: No focal deficit present.     Mental Status: She is alert and oriented to person, place, and time.  Psychiatric:        Mood and Affect: Mood normal.        Behavior: Behavior normal.     Assessment & Plan:  Neck pain  Obesity (BMI 30.0-34.9)  Chronic bilateral thoracic back pain  Symptomatic mammary hypertrophy Plan for bilateral breast reduction.  The risk that can be encountered with breast reduction were discussed and include the following but not limited to these:  Breast asymmetry, fluid accumulation, firmness of the breast, inability to breast feed, loss of nipple or areola, skin loss, decrease or no nipple sensation, fat necrosis of the breast tissue, bleeding, infection, healing delay.  There are risks of anesthesia, changes to skin sensation and injury to nerves or blood vessels.  The muscle can be temporarily or permanently injured.  You may have an allergic reaction to tape, suture, glue, blood products which can result in skin discoloration, swelling, pain, skin lesions, poor healing.  Any of these can lead to the need for revisonal surgery or stage procedures.  A reduction has potential to interfere with diagnostic procedures.  Nipple or breast piercing can  increase risks of infection.  This procedure is best done when the breast is fully developed.  Changes in the breast will continue to occur over time.  Pregnancy can alter the outcomes of previous breast reduction surgery, weight gain and weigh loss can also effect the long term appearance.   Prescriptions sent to pharmacy.  [place], DO

## 2019-05-04 ENCOUNTER — Encounter: Payer: No Typology Code available for payment source | Admitting: Plastic Surgery

## 2019-05-05 ENCOUNTER — Encounter (HOSPITAL_BASED_OUTPATIENT_CLINIC_OR_DEPARTMENT_OTHER): Payer: Self-pay | Admitting: *Deleted

## 2019-05-05 ENCOUNTER — Other Ambulatory Visit: Payer: Self-pay

## 2019-05-10 ENCOUNTER — Other Ambulatory Visit (HOSPITAL_COMMUNITY)
Admission: RE | Admit: 2019-05-10 | Discharge: 2019-05-10 | Disposition: A | Discharge status: Home / Self Care | Payer: No Typology Code available for payment source | Source: Ambulatory Visit | Attending: Plastic Surgery | Admitting: Plastic Surgery

## 2019-05-10 DIAGNOSIS — Z1159 Encounter for screening for other viral diseases: Secondary | ICD-10-CM | POA: Insufficient documentation

## 2019-05-10 LAB — SARS CORONAVIRUS 2 BY RT PCR (HOSPITAL ORDER, PERFORMED IN ~~LOC~~ HOSPITAL LAB): SARS Coronavirus 2: NEGATIVE

## 2019-05-12 ENCOUNTER — Ambulatory Visit (HOSPITAL_BASED_OUTPATIENT_CLINIC_OR_DEPARTMENT_OTHER)
Admission: RE | Admit: 2019-05-12 | Discharge: 2019-05-12 | Disposition: A | Discharge status: Home / Self Care | Payer: No Typology Code available for payment source | Attending: Plastic Surgery | Admitting: Plastic Surgery

## 2019-05-12 ENCOUNTER — Ambulatory Visit (HOSPITAL_BASED_OUTPATIENT_CLINIC_OR_DEPARTMENT_OTHER): Payer: No Typology Code available for payment source | Admitting: Anesthesiology

## 2019-05-12 ENCOUNTER — Encounter (HOSPITAL_BASED_OUTPATIENT_CLINIC_OR_DEPARTMENT_OTHER)
Admission: RE | Disposition: A | Discharge status: Home / Self Care | Payer: Self-pay | Source: Home / Self Care | Attending: Plastic Surgery

## 2019-05-12 ENCOUNTER — Other Ambulatory Visit: Payer: Self-pay

## 2019-05-12 ENCOUNTER — Encounter (HOSPITAL_BASED_OUTPATIENT_CLINIC_OR_DEPARTMENT_OTHER): Payer: Self-pay

## 2019-05-12 DIAGNOSIS — Z793 Long term (current) use of hormonal contraceptives: Secondary | ICD-10-CM | POA: Insufficient documentation

## 2019-05-12 DIAGNOSIS — D360 Benign neoplasm of lymph nodes: Secondary | ICD-10-CM | POA: Diagnosis not present

## 2019-05-12 DIAGNOSIS — Z68.41 Body mass index (BMI) pediatric, greater than or equal to 95th percentile for age: Secondary | ICD-10-CM | POA: Diagnosis not present

## 2019-05-12 DIAGNOSIS — N62 Hypertrophy of breast: Secondary | ICD-10-CM

## 2019-05-12 DIAGNOSIS — D242 Benign neoplasm of left breast: Secondary | ICD-10-CM | POA: Insufficient documentation

## 2019-05-12 DIAGNOSIS — M542 Cervicalgia: Secondary | ICD-10-CM

## 2019-05-12 DIAGNOSIS — M549 Dorsalgia, unspecified: Secondary | ICD-10-CM | POA: Diagnosis not present

## 2019-05-12 HISTORY — PX: BREAST REDUCTION SURGERY: SHX8

## 2019-05-12 HISTORY — DX: Hypertrophy of breast: N62

## 2019-05-12 LAB — POCT PREGNANCY, URINE: Preg Test, Ur: NEGATIVE

## 2019-05-12 SURGERY — MAMMOPLASTY, REDUCTION
Anesthesia: General | Site: Breast | Laterality: Bilateral

## 2019-05-12 MED ORDER — ACETAMINOPHEN 160 MG/5ML PO SOLN: 325.0000 mg | ORAL | Status: DC | PRN

## 2019-05-12 MED ORDER — SUGAMMADEX SODIUM 200 MG/2ML IV SOLN
INTRAVENOUS | Status: DC | PRN
  Administered 2019-05-12: 200 mg via INTRAVENOUS

## 2019-05-12 MED ORDER — SODIUM CHLORIDE 0.9% FLUSH: 3.0000 mL | INTRAVENOUS | Status: DC | PRN

## 2019-05-12 MED ORDER — LIDOCAINE HCL 1 % IJ SOLN
INTRAMUSCULAR | Status: DC | PRN
  Administered 2019-05-12: 50 mL

## 2019-05-12 MED ORDER — CELECOXIB 200 MG PO CAPS
200.0000 mg | ORAL_CAPSULE | Freq: Once | ORAL | Status: AC
  Administered 2019-05-12: 200 mg via ORAL

## 2019-05-12 MED ORDER — SUFENTANIL CITRATE 50 MCG/ML IV SOLN
INTRAVENOUS | Status: DC | PRN
  Administered 2019-05-12 (×2): 10 ug via INTRAVENOUS
  Administered 2019-05-12 (×2): 5 ug via INTRAVENOUS
  Administered 2019-05-12 (×2): 10 ug via INTRAVENOUS

## 2019-05-12 MED ORDER — DEXAMETHASONE SODIUM PHOSPHATE 10 MG/ML IJ SOLN
INTRAMUSCULAR | Status: AC
  Filled 2019-05-12: qty 1

## 2019-05-12 MED ORDER — SCOPOLAMINE 1 MG/3DAYS TD PT72: 1.0000 | MEDICATED_PATCH | Freq: Once | TRANSDERMAL | Status: DC | PRN

## 2019-05-12 MED ORDER — MIDAZOLAM HCL 2 MG/2ML IJ SOLN
INTRAMUSCULAR | Status: AC
  Filled 2019-05-12: qty 2

## 2019-05-12 MED ORDER — ACETAMINOPHEN 650 MG RE SUPP: 650.0000 mg | RECTAL | Status: DC | PRN

## 2019-05-12 MED ORDER — SODIUM CHLORIDE 0.9 % IV SOLN
INTRAVENOUS | Status: DC | PRN
  Administered 2019-05-12: 500 mL

## 2019-05-12 MED ORDER — ONDANSETRON HCL 4 MG/2ML IJ SOLN
INTRAMUSCULAR | Status: AC
  Filled 2019-05-12: qty 2

## 2019-05-12 MED ORDER — SUGAMMADEX SODIUM 500 MG/5ML IV SOLN
INTRAVENOUS | Status: AC
  Filled 2019-05-12: qty 5

## 2019-05-12 MED ORDER — SODIUM CHLORIDE 0.9 % IV SOLN: 250.0000 mL | INTRAVENOUS | Status: DC | PRN

## 2019-05-12 MED ORDER — CELECOXIB 200 MG PO CAPS
ORAL_CAPSULE | ORAL | Status: AC
  Filled 2019-05-12: qty 1

## 2019-05-12 MED ORDER — ROCURONIUM BROMIDE 100 MG/10ML IV SOLN
INTRAVENOUS | Status: DC | PRN
  Administered 2019-05-12: 50 mg via INTRAVENOUS

## 2019-05-12 MED ORDER — MEPERIDINE HCL 25 MG/ML IJ SOLN: 6.2500 mg | INTRAMUSCULAR | Status: DC | PRN

## 2019-05-12 MED ORDER — OXYCODONE HCL 5 MG/5ML PO SOLN: 5.0000 mg | Freq: Once | ORAL | Status: DC | PRN

## 2019-05-12 MED ORDER — SUCCINYLCHOLINE CHLORIDE 200 MG/10ML IV SOSY
PREFILLED_SYRINGE | INTRAVENOUS | Status: AC
  Filled 2019-05-12: qty 10

## 2019-05-12 MED ORDER — EPHEDRINE 5 MG/ML INJ
INTRAVENOUS | Status: AC
  Filled 2019-05-12: qty 10

## 2019-05-12 MED ORDER — ACETAMINOPHEN 325 MG PO TABS: 650.0000 mg | ORAL_TABLET | ORAL | Status: DC | PRN

## 2019-05-12 MED ORDER — SODIUM CHLORIDE 0.9% FLUSH: 3.0000 mL | Freq: Two times a day (BID) | INTRAVENOUS | Status: DC

## 2019-05-12 MED ORDER — PHENYLEPHRINE 40 MCG/ML (10ML) SYRINGE FOR IV PUSH (FOR BLOOD PRESSURE SUPPORT)
PREFILLED_SYRINGE | INTRAVENOUS | Status: AC
  Filled 2019-05-12: qty 10

## 2019-05-12 MED ORDER — FENTANYL CITRATE (PF) 100 MCG/2ML IJ SOLN
INTRAMUSCULAR | Status: AC
  Filled 2019-05-12: qty 2

## 2019-05-12 MED ORDER — LIDOCAINE 2% (20 MG/ML) 5 ML SYRINGE
INTRAMUSCULAR | Status: AC
  Filled 2019-05-12: qty 5

## 2019-05-12 MED ORDER — LACTATED RINGERS IV SOLN
INTRAVENOUS | Status: AC | PRN
  Administered 2019-05-12: 1000 mL
  Administered 2019-05-12: 700 mL

## 2019-05-12 MED ORDER — CEFAZOLIN SODIUM-DEXTROSE 2-4 GM/100ML-% IV SOLN
INTRAVENOUS | Status: AC
  Filled 2019-05-12: qty 100

## 2019-05-12 MED ORDER — FENTANYL CITRATE (PF) 100 MCG/2ML IJ SOLN: 50.0000 ug | INTRAMUSCULAR | Status: DC | PRN

## 2019-05-12 MED ORDER — OXYCODONE HCL 5 MG PO TABS: 5.0000 mg | ORAL_TABLET | ORAL | Status: DC | PRN

## 2019-05-12 MED ORDER — SUFENTANIL CITRATE 50 MCG/ML IV SOLN
INTRAVENOUS | Status: AC
  Filled 2019-05-12: qty 1

## 2019-05-12 MED ORDER — ACETAMINOPHEN 500 MG PO TABS
1000.0000 mg | ORAL_TABLET | Freq: Once | ORAL | Status: AC
  Administered 2019-05-12: 09:00:00 1000 mg via ORAL

## 2019-05-12 MED ORDER — LACTATED RINGERS IV SOLN
INTRAVENOUS | Status: DC
  Administered 2019-05-12 (×3): via INTRAVENOUS

## 2019-05-12 MED ORDER — MIDAZOLAM HCL 2 MG/2ML IJ SOLN
1.0000 mg | INTRAMUSCULAR | Status: DC | PRN
  Administered 2019-05-12: 2 mg via INTRAVENOUS

## 2019-05-12 MED ORDER — ACETAMINOPHEN 500 MG PO TABS
ORAL_TABLET | ORAL | Status: AC
  Filled 2019-05-12: qty 2

## 2019-05-12 MED ORDER — OXYCODONE HCL 5 MG PO TABS: 5.0000 mg | ORAL_TABLET | Freq: Once | ORAL | Status: DC | PRN

## 2019-05-12 MED ORDER — PROPOFOL 500 MG/50ML IV EMUL: INTRAVENOUS | Status: DC | PRN

## 2019-05-12 MED ORDER — PROPOFOL 500 MG/50ML IV EMUL
INTRAVENOUS | Status: DC | PRN
  Administered 2019-05-12: 25 ug/kg/min via INTRAVENOUS

## 2019-05-12 MED ORDER — EPINEPHRINE PF 1 MG/ML IJ SOLN
INTRAMUSCULAR | Status: DC | PRN
  Administered 2019-05-12: 1 mg

## 2019-05-12 MED ORDER — ONDANSETRON HCL 4 MG/2ML IJ SOLN: 4.0000 mg | Freq: Once | INTRAMUSCULAR | Status: DC | PRN

## 2019-05-12 MED ORDER — LIDOCAINE HCL (PF) 1 % IJ SOLN
INTRAMUSCULAR | Status: AC
  Filled 2019-05-12: qty 30

## 2019-05-12 MED ORDER — DEXAMETHASONE SODIUM PHOSPHATE 4 MG/ML IJ SOLN
INTRAMUSCULAR | Status: DC | PRN
  Administered 2019-05-12: 10 mg via INTRAVENOUS

## 2019-05-12 MED ORDER — LIDOCAINE HCL (CARDIAC) PF 100 MG/5ML IV SOSY
PREFILLED_SYRINGE | INTRAVENOUS | Status: DC | PRN
  Administered 2019-05-12: 75 mg via INTRAVENOUS

## 2019-05-12 MED ORDER — ACETAMINOPHEN 325 MG PO TABS: 325.0000 mg | ORAL_TABLET | ORAL | Status: DC | PRN

## 2019-05-12 MED ORDER — FENTANYL CITRATE (PF) 100 MCG/2ML IJ SOLN
25.0000 ug | INTRAMUSCULAR | Status: DC | PRN
  Administered 2019-05-12: 25 ug via INTRAVENOUS

## 2019-05-12 MED ORDER — CEFAZOLIN SODIUM-DEXTROSE 2-4 GM/100ML-% IV SOLN
2.0000 g | INTRAVENOUS | Status: AC
  Administered 2019-05-12: 2 g via INTRAVENOUS

## 2019-05-12 MED ORDER — GABAPENTIN 300 MG PO CAPS
300.0000 mg | ORAL_CAPSULE | Freq: Once | ORAL | Status: AC
  Administered 2019-05-12: 300 mg via ORAL

## 2019-05-12 MED ORDER — GABAPENTIN 300 MG PO CAPS
ORAL_CAPSULE | ORAL | Status: AC
  Filled 2019-05-12: qty 1

## 2019-05-12 SURGICAL SUPPLY — 67 items
ADH SKN CLS APL DERMABOND .7 (GAUZE/BANDAGES/DRESSINGS) ×2
APL PRP STRL LF DISP 70% ISPRP (MISCELLANEOUS) ×1
BAG DECANTER FOR FLEXI CONT (MISCELLANEOUS) ×2 IMPLANT
BINDER BREAST LRG (GAUZE/BANDAGES/DRESSINGS) IMPLANT
BINDER BREAST MEDIUM (GAUZE/BANDAGES/DRESSINGS) IMPLANT
BINDER BREAST XLRG (GAUZE/BANDAGES/DRESSINGS) IMPLANT
BINDER BREAST XXLRG (GAUZE/BANDAGES/DRESSINGS) IMPLANT
BIOPATCH RED 1 DISK 7.0 (GAUZE/BANDAGES/DRESSINGS) IMPLANT
BLADE HEX COATED 2.75 (ELECTRODE) ×2 IMPLANT
BLADE KNIFE PERSONA 10 (BLADE) ×4 IMPLANT
BLADE SURG 15 STRL LF DISP TIS (BLADE) IMPLANT
BLADE SURG 15 STRL SS (BLADE)
BNDG GAUZE ELAST 4 BULKY (GAUZE/BANDAGES/DRESSINGS) ×4 IMPLANT
CANISTER SUCT 1200ML W/VALVE (MISCELLANEOUS) ×2 IMPLANT
CHLORAPREP W/TINT 26 (MISCELLANEOUS) ×2 IMPLANT
COVER BACK TABLE REUSABLE LG (DRAPES) ×2 IMPLANT
COVER MAYO STAND REUSABLE (DRAPES) ×2 IMPLANT
COVER WAND RF STERILE (DRAPES) IMPLANT
DECANTER SPIKE VIAL GLASS SM (MISCELLANEOUS) IMPLANT
DERMABOND ADVANCED (GAUZE/BANDAGES/DRESSINGS) ×2
DERMABOND ADVANCED .7 DNX12 (GAUZE/BANDAGES/DRESSINGS) IMPLANT
DRAIN CHANNEL 19F RND (DRAIN) IMPLANT
DRAPE LAPAROSCOPIC ABDOMINAL (DRAPES) ×2 IMPLANT
DRSG PAD ABDOMINAL 8X10 ST (GAUZE/BANDAGES/DRESSINGS) ×4 IMPLANT
ELECT BLADE 4.0 EZ CLEAN MEGAD (MISCELLANEOUS)
ELECT REM PT RETURN 9FT ADLT (ELECTROSURGICAL) ×2
ELECTRODE BLDE 4.0 EZ CLN MEGD (MISCELLANEOUS) IMPLANT
ELECTRODE REM PT RTRN 9FT ADLT (ELECTROSURGICAL) ×1 IMPLANT
EVACUATOR SILICONE 100CC (DRAIN) IMPLANT
GAUZE SPONGE 4X4 12PLY STRL LF (GAUZE/BANDAGES/DRESSINGS) ×2 IMPLANT
GLOVE BIO SURGEON STRL SZ 6.5 (GLOVE) ×7 IMPLANT
GLOVE BIO SURGEON STRL SZ7 (GLOVE) ×1 IMPLANT
GLOVE BIOGEL PI IND STRL 7.0 (GLOVE) IMPLANT
GLOVE BIOGEL PI INDICATOR 7.0 (GLOVE) ×1
GOWN STRL REUS W/ TWL LRG LVL3 (GOWN DISPOSABLE) ×3 IMPLANT
GOWN STRL REUS W/TWL LRG LVL3 (GOWN DISPOSABLE) ×6
NDL HYPO 25X1 1.5 SAFETY (NEEDLE) ×1 IMPLANT
NDL SAFETY ECLIPSE 18X1.5 (NEEDLE) IMPLANT
NEEDLE HYPO 18GX1.5 SHARP (NEEDLE) ×2
NEEDLE HYPO 25X1 1.5 SAFETY (NEEDLE) ×2 IMPLANT
NS IRRIG 1000ML POUR BTL (IV SOLUTION) IMPLANT
PACK BASIN DAY SURGERY FS (CUSTOM PROCEDURE TRAY) ×2 IMPLANT
PAD ALCOHOL SWAB (MISCELLANEOUS) ×1 IMPLANT
PENCIL BUTTON HOLSTER BLD 10FT (ELECTRODE) ×2 IMPLANT
PIN SAFETY STERILE (MISCELLANEOUS) IMPLANT
SLEEVE SCD COMPRESS KNEE MED (MISCELLANEOUS) ×2 IMPLANT
SPONGE LAP 18X18 RF (DISPOSABLE) ×4 IMPLANT
STRIP SUTURE WOUND CLOSURE 1/2 (SUTURE) ×6 IMPLANT
SUT MNCRL AB 4-0 PS2 18 (SUTURE) ×8 IMPLANT
SUT MON AB 3-0 SH 27 (SUTURE) ×4
SUT MON AB 3-0 SH27 (SUTURE) ×1 IMPLANT
SUT MON AB 5-0 PS2 18 (SUTURE) ×7 IMPLANT
SUT PDS 3-0 CT2 (SUTURE)
SUT PDS AB 2-0 CT2 27 (SUTURE) IMPLANT
SUT PDS II 3-0 CT2 27 ABS (SUTURE) IMPLANT
SUT SILK 3 0 PS 1 (SUTURE) IMPLANT
SYR 3ML 23GX1 SAFETY (SYRINGE) ×2 IMPLANT
SYR 50ML LL SCALE MARK (SYRINGE) ×1 IMPLANT
SYR BULB IRRIGATION 50ML (SYRINGE) ×2 IMPLANT
SYR CONTROL 10ML LL (SYRINGE) ×2 IMPLANT
TAPE MEASURE VINYL STERILE (MISCELLANEOUS) ×2 IMPLANT
TOWEL GREEN STERILE FF (TOWEL DISPOSABLE) ×4 IMPLANT
TUBE CONNECTING 20X1/4 (TUBING) ×2 IMPLANT
TUBING INFILTRATION IT-10001 (TUBING) ×1 IMPLANT
TUBING SET GRADUATE ASPIR 12FT (MISCELLANEOUS) ×1 IMPLANT
UNDERPAD 30X30 (UNDERPADS AND DIAPERS) ×4 IMPLANT
YANKAUER SUCT BULB TIP NO VENT (SUCTIONS) ×2 IMPLANT

## 2019-05-12 NOTE — Interval H&P Note (Signed)
History and Physical Interval Note:  05/12/2019 10:46 AM  Gabriella Phillips  has presented today for surgery, with the diagnosis of mammary hypertrophy.  The various methods of treatment have been discussed with the patient and family. After consideration of risks, benefits and other options for treatment, the patient has consented to  Procedure(s) with comments: MAMMARY REDUCTION  (BREAST) (Bilateral) - Please adjust timeframe to 4 hours (240 min) as a surgical intervention.  The patient's history has been reviewed, patient examined, no change in status, stable for surgery.  I have reviewed the patient's chart and labs.  Questions were answered to the patient's satisfaction.     Loel Lofty Marlies Ligman

## 2019-05-12 NOTE — Anesthesia Preprocedure Evaluation (Signed)
Anesthesia Evaluation  Patient identified by MRN, date of birth, ID band Patient awake    Reviewed: Allergy & Precautions, H&P , NPO status , Patient's Chart, lab work & pertinent test results, reviewed documented beta blocker date and time   Airway Mallampati: II  TM Distance: >3 FB Neck ROM: full    Dental no notable dental hx.    Pulmonary neg pulmonary ROS,    Pulmonary exam normal breath sounds clear to auscultation       Cardiovascular Exercise Tolerance: Good negative cardio ROS   Rhythm:regular Rate:Normal     Neuro/Psych negative neurological ROS  negative psych ROS   GI/Hepatic negative GI ROS, Neg liver ROS,   Endo/Other  Morbid obesity  Renal/GU negative Renal ROS  negative genitourinary   Musculoskeletal   Abdominal (+) + obese,   Peds  Hematology negative hematology ROS (+)   Anesthesia Other Findings   Reproductive/Obstetrics negative OB ROS                             Anesthesia Physical Anesthesia Plan  ASA: II  Anesthesia Plan: General   Post-op Pain Management:    Induction: Intravenous  PONV Risk Score and Plan: 3 and Ondansetron, Dexamethasone, Treatment may vary due to age or medical condition and Midazolam  Airway Management Planned: Oral ETT  Additional Equipment:   Intra-op Plan:   Post-operative Plan: Extubation in OR  Informed Consent: I have reviewed the patients History and Physical, chart, labs and discussed the procedure including the risks, benefits and alternatives for the proposed anesthesia with the patient or authorized representative who has indicated his/her understanding and acceptance.     Dental Advisory Given  Plan Discussed with: CRNA, Anesthesiologist and Surgeon  Anesthesia Plan Comments: (  )        Anesthesia Quick Evaluation

## 2019-05-12 NOTE — Op Note (Signed)
Breast Reduction Op note:    DATE OF PROCEDURE: 05/12/2019  LOCATION: [facility]  SURGEON: Lyndee Leo Sanger Lindia Garms, DO  PREOPERATIVE DIAGNOSIS 1. Macromastia 2. Neck Pain 3. Back Pain  POSTOPERATIVE DIAGNOSIS 1. Macromastia 2. Neck Pain 3. Back Pain  PROCEDURES 1. Bilateral breast reduction.  Right reduction 75 7g, Left reduction 741 g  COMPLICATIONS: None.  DRAINS: none  INDICATIONS FOR PROCEDURE Elliannah Wayment is a 18 y.o. year-old female born on 07/29/2001,with a history of symptomatic macromastia with concominant back pain, neck pain, shoulder grooving from her bra.   MRN: [id number]  CONSENT Informed consent was obtained directly from the patient. The risks, benefits and alternatives were fully discussed. Specific risks including but not limited to bleeding, infection, hematoma, seroma, scarring, pain, nipple necrosis, asymmetry, poor cosmetic results, and need for further surgery were discussed. The patient had ample opportunity to have her questions answered to her satisfaction.  DESCRIPTION OF PROCEDURE  Patient was brought into the operating room and placed in a supine position.  SCDs were placed and appropriate padding was performed.  Antibiotics were given. The patient underwent general anesthesia and the chest was prepped and draped in a sterile fashion.  A timeout was performed and all information was confirmed to be correct.  Tumescent was placed in the inferior lateral aspect of both breasts to help with the reduction.  Right side: Preoperative markings were confirmed.  Incision lines were injected with 1% Xylocaine with epinephrine.  Liposuction was done at the inferior lateral area of the breast.  After waiting for vasoconstriction, the marked lines were incised.  A Wise-pattern superomedial breast reduction was performed by de-epithelializing the pedicle, using bovie to create the superomedial pedicle, and removing breast tissue from the  superior, lateral, and inferior portions of the breast.  Care was taken to not undermine the breast pedicle. Hemostasis was achieved.  The nipple was gently rotated into position and the soft tissue closed with 4-0 Monocryl.   The pocket was irrigated and hemostasis confirmed.  The deep tissues were approximated with 3-0 monocryl sutures and the skin was closed with deep dermal and subcuticular 4-0 Monocryl sutures.  The nipple and skin flaps had good capillary refill at the end of the procedure.    Left side: Preoperative markings were confirmed.  Incision lines were injected with 1% Xylocaine with epinephrine.  Liposuction was done at the lateral inferior aspect of the breast.  After waiting for vasoconstriction, the marked lines were incised.  A Wise-pattern superomedial breast reduction was performed by de-epithelializing the pedicle, using bovie to create the superomedial pedicle, and removing breast tissue from the superior, lateral, and inferior portions of the breast.  Care was taken to not undermine the breast pedicle. Hemostasis was achieved.  The nipple was gently rotated into position and the soft tissue was closed with 4-0 Monocryl.  The patient was sat upright and size and shape symmetry was confirmed.  The pocket was irrigated and hemostasis confirmed.  The deep tissues were approximated with 3-0 monocryl sutures and the skin was closed with deep dermal and subcuticular 4-0 Monocryl sutures.  Dermabond was applied.  A breast binder and ABDs were placed.  The nipple and skin flaps had good capillary refill at the end of the procedure.  The patient tolerated the procedure well. The patient was allowed to wake from anesthesia and taken to the recovery room in satisfactory condition

## 2019-05-12 NOTE — Anesthesia Procedure Notes (Signed)

## 2019-05-12 NOTE — Anesthesia Postprocedure Evaluation (Signed)
Anesthesia Post Note  Patient: Marene K Gallacher  Procedure(s) Performed: MAMMARY REDUCTION  (BREAST) (Bilateral Breast)     Patient location during evaluation: PACU Anesthesia Type: General Level of consciousness: awake and alert Pain management: pain level controlled Vital Signs Assessment: post-procedure vital signs reviewed and stable Respiratory status: spontaneous breathing, nonlabored ventilation and respiratory function stable Cardiovascular status: blood pressure returned to baseline and stable Postop Assessment: no apparent nausea or vomiting Anesthetic complications: no    Last Vitals:  Vitals:   05/12/19 1545 05/12/19 1600  BP: 107/70 111/70  Pulse: 99 (!) 101  Resp: 19 17  Temp:    SpO2: 97% 96%    Last Pain:  Vitals:   05/12/19 1600  TempSrc:   PainSc: 1                  Rivaan Kendall A.

## 2019-05-12 NOTE — Discharge Instructions (Signed)
NO TYLENOL BEFORE 3:30 PM today!       INSTRUCTIONS FOR AFTER SURGERY   You are having surgery.  You will likely have some questions about what to expect following your operation.  The following information will help you and your family understand what to expect when you are discharged from the hospital.  Following these guidelines will help ensure a smooth recovery and reduce risks of complications.   Postoperative instructions include information on: diet, wound care, medications and physical activity.  AFTER SURGERY Expect to go home after the procedure.  In some cases, you may need to spend one night in the hospital for observation.  DIET This surgery does not require a specific diet.  However, I have to mention that the healthier you eat the better your body can start healing. It is important to increasing your protein intake.  This means limiting the foods with sugar and carbohydrates.  Focus on vegetables and some meat.  If you have any liposuction during your procedure be sure to drink water.  If your urine is bright yellow, then it is concentrated, and you need to drink more water.  As a general rule after surgery, you should have 8 ounces of water every hour while awake.  If you find you are persistently nauseated or unable to take in liquids let us know.  NO TOBACCO USE or EXPOSURE.  This will slow your healing process and increase the risk of a wound.  WOUND CARE You can shower the day after surgery if you don't have a drain.  Use fragrance free soap.  Dial, [place] and [place] are usually mild on the skin. If you have a drain clean with baby wipes until the drain is removed.  If you have steri-strips / tape directly attached to your skin leave them in place. It is OK to get these wet.  No baths, pools or hot tubs for two weeks. We close your incision to leave the smallest and best-looking scar. No ointment or creams on your incisions until given the go ahead.  Especially not Neosporin  (Too many skin reactions with this one).  A few weeks after surgery you can use Mederma and start massaging the scar. We ask you to wear your binder or sports bra for the first 6 weeks around the clock, including while sleeping. This provides added comfort and helps reduce the fluid accumulation at the surgery site.  ACTIVITY No heavy lifting until cleared by the doctor.  It is OK to walk and climb stairs. In fact, moving your legs is very important to decrease your risk of a blood clot.  It will also help keep you from getting deconditioned.  Every 1 to 2 hours get up and walk for 5 minutes. This will help with a quicker recovery back to normal.  Let pain be your guide so you don't do too much.  NO, you cannot do the spring cleaning and don't plan on taking care of anyone else.  This is your time for TLC.  You will be more comfortable if you sleep and rest with your head elevated either with a few pillows under you or in a recliner.  No stomach sleeping for a few months.  WORK Everyone returns to work at different times. As a rough guide, most people take at least 1 - 2 weeks off prior to returning to work. If you need documentation for your job, bring the forms to your postoperative follow up visit.  DRIVING  Arrange for someone to bring you home from the hospital.  You may be able to drive a few days after surgery but not while taking any narcotics or valium.  BOWEL MOVEMENTS Constipation can occur after anesthesia and while taking pain medication.  It is important to stay ahead for your comfort.  We recommend taking Milk of Magnesia (2 tablespoons; twice a day) while taking the pain pills.  SEROMA This is fluid your body tried to put in the surgical site.  This is normal but if it creates tight skinny skin let us know.  It usually decreases in a few weeks.  WHEN TO CALL Call your surgeon's office if any of the following occur:  Fever 101 degrees F or greater  Excessive bleeding or fluid  from the incision site.  Pain that increases over time without aid from the medications  Redness, warmth, or pus draining from incision sites  Persistent nausea or inability to take in liquids  Severe misshapen area that underwent the operation.       Post Anesthesia Home Care Instructions  Activity: Get plenty of rest for the remainder of the day. A responsible individual must stay with you for 24 hours following the procedure.  For the next 24 hours, DO NOT: -Drive a car -Paediatric nurse -Drink alcoholic beverages -Take any medication unless instructed by your physician -Make any legal decisions or sign important papers.  Meals: Start with liquid foods such as gelatin or soup. Progress to regular foods as tolerated. Avoid greasy, spicy, heavy foods. If nausea and/or vomiting occur, drink only clear liquids until the nausea and/or vomiting subsides. Call your physician if vomiting continues.  Special Instructions/Symptoms: Your throat may feel dry or sore from the anesthesia or the breathing tube placed in your throat during surgery. If this causes discomfort, gargle with warm salt water. The discomfort should disappear within 24 hours.  If you had a scopolamine patch placed behind your ear for the management of post- operative nausea and/or vomiting:  1. The medication in the patch is effective for 72 hours, after which it should be removed.  Wrap patch in a tissue and discard in the trash. Wash hands thoroughly with soap and water. 2. You may remove the patch earlier than 72 hours if you experience unpleasant side effects which may include dry mouth, dizziness or visual disturbances. 3. Avoid touching the patch. Wash your hands with soap and water after contact with the patch.

## 2019-05-12 NOTE — Transfer of Care (Signed)
Immediate Anesthesia Transfer of Care Note  Patient: Gabriella Phillips  Procedure(s) Performed: MAMMARY REDUCTION  (BREAST) (Bilateral Breast)  Patient Location: PACU  Anesthesia Type:General  Level of Consciousness: awake and drowsy  Airway & Oxygen Therapy: Patient Spontanous Breathing and Patient connected to face mask oxygen  Post-op Assessment: Report given to RN and Post -op Vital signs reviewed and stable  Post vital signs: Reviewed and stable  Last Vitals:  Vitals Value Taken Time  BP 118/59 05/12/2019  3:10 PM  Temp    Pulse 113 05/12/2019  3:14 PM  Resp 24 05/12/2019  3:14 PM  SpO2 100 % 05/12/2019  3:14 PM  Vitals shown include unvalidated device data.  Last Pain:  Vitals:   05/12/19 0910  TempSrc: Oral  PainSc: 0-No pain         Complications: No apparent anesthesia complications

## 2019-05-13 ENCOUNTER — Encounter (HOSPITAL_BASED_OUTPATIENT_CLINIC_OR_DEPARTMENT_OTHER): Payer: Self-pay | Admitting: Plastic Surgery

## 2019-05-20 ENCOUNTER — Telehealth: Payer: Self-pay | Admitting: Plastic Surgery

## 2019-05-20 NOTE — Telephone Encounter (Signed)

## 2019-05-21 ENCOUNTER — Encounter: Payer: Self-pay | Admitting: Plastic Surgery

## 2019-05-21 ENCOUNTER — Other Ambulatory Visit: Payer: Self-pay

## 2019-05-21 ENCOUNTER — Ambulatory Visit (INDEPENDENT_AMBULATORY_CARE_PROVIDER_SITE_OTHER): Payer: No Typology Code available for payment source | Admitting: Plastic Surgery

## 2019-05-21 VITALS — BP 118/75 | HR 86 | Temp 98.8°F | Ht 65.0 in | Wt 183.0 lb

## 2019-05-21 DIAGNOSIS — G8929 Other chronic pain: Secondary | ICD-10-CM

## 2019-05-21 DIAGNOSIS — M546 Pain in thoracic spine: Secondary | ICD-10-CM

## 2019-05-21 DIAGNOSIS — M542 Cervicalgia: Secondary | ICD-10-CM

## 2019-05-21 DIAGNOSIS — N62 Hypertrophy of breast: Secondary | ICD-10-CM

## 2019-05-21 NOTE — Progress Notes (Signed)
The patient is a 18 year old black female here for follow-up on her breast reduction.  All incisions are healing very nicely.  There is no sign of seroma or hematoma today.  She said that her back pain and neck pain was improved with then 48 hours of the surgery.  She is very pleased with her progress.  She is wondering if the breast size will go down at all.  She would like it to be just a little bit smaller.  I completely understand.  We will have to wait and see.  I like to see her back in 3 months. Pictures were obtained of the patient and placed in the chart with the patient's or guardian's permission.

## 2019-05-24 MED FILL — FEMYNOR 0.25-35 MG-MCG TABS: 0.25-35 | 84 days supply | Qty: 84 | Fill #0

## 2019-07-14 IMAGING — US ULTRASOUND LEFT BREAST LIMITED
1 series · 6 of 6 positions shown · non-contrast
Comparison: Previous ultrasound dated 05/01/2018.

CLINICAL DATA: Follow-up for probably benign mass within the LEFT
breast.

[Series 1: ultrasound left breast limited · 0.08mm/px · 6 of 6 slices shown]
[im 1/6]
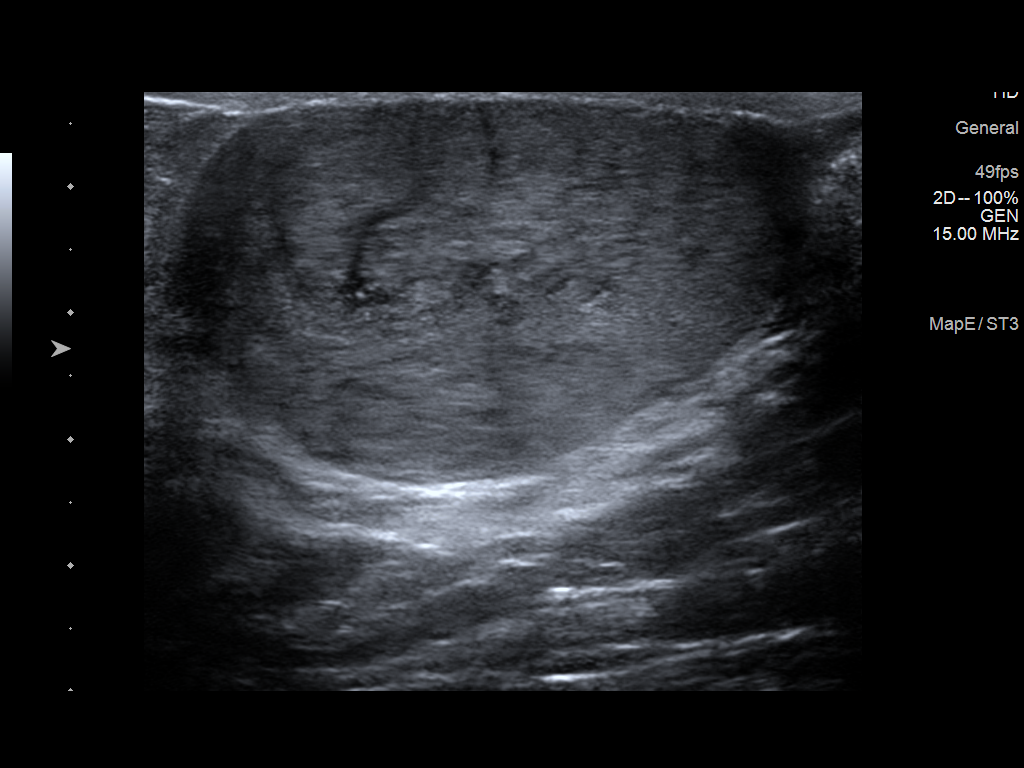
[im 2/6]
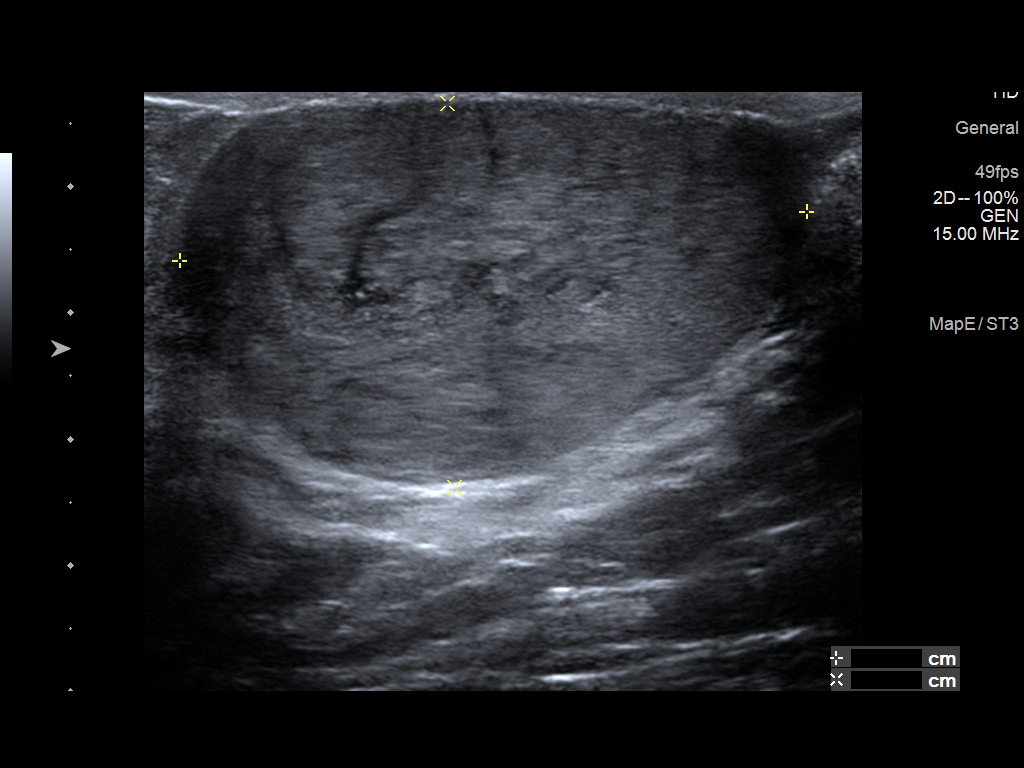
[im 3/6]
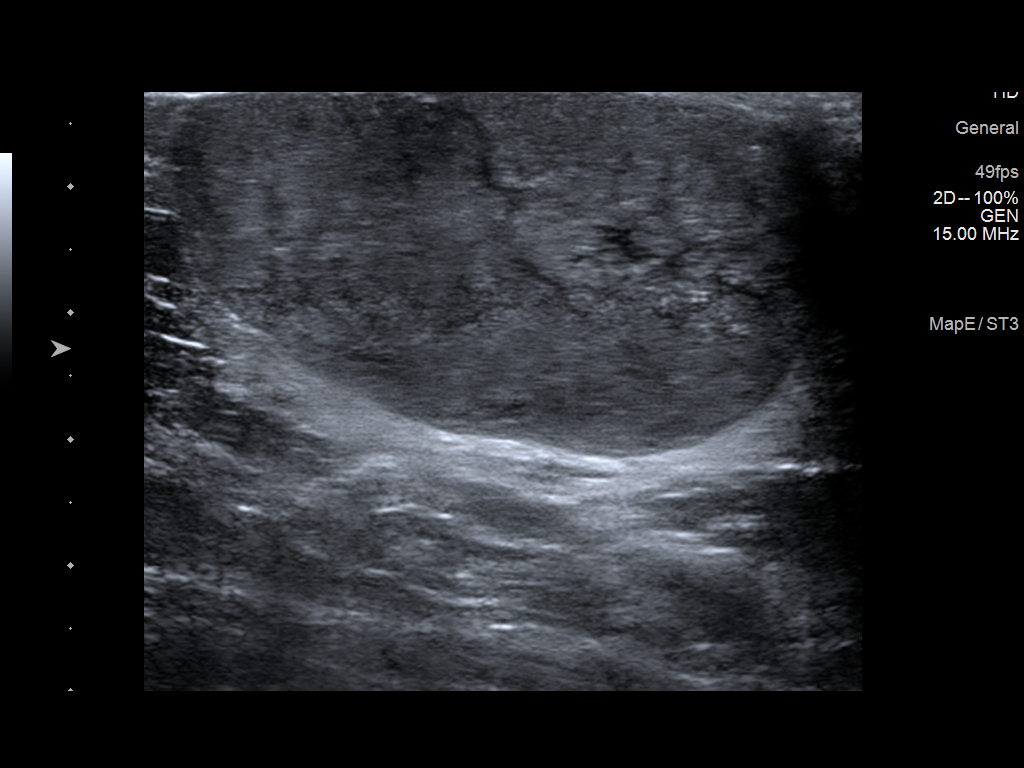
[im 4/6]
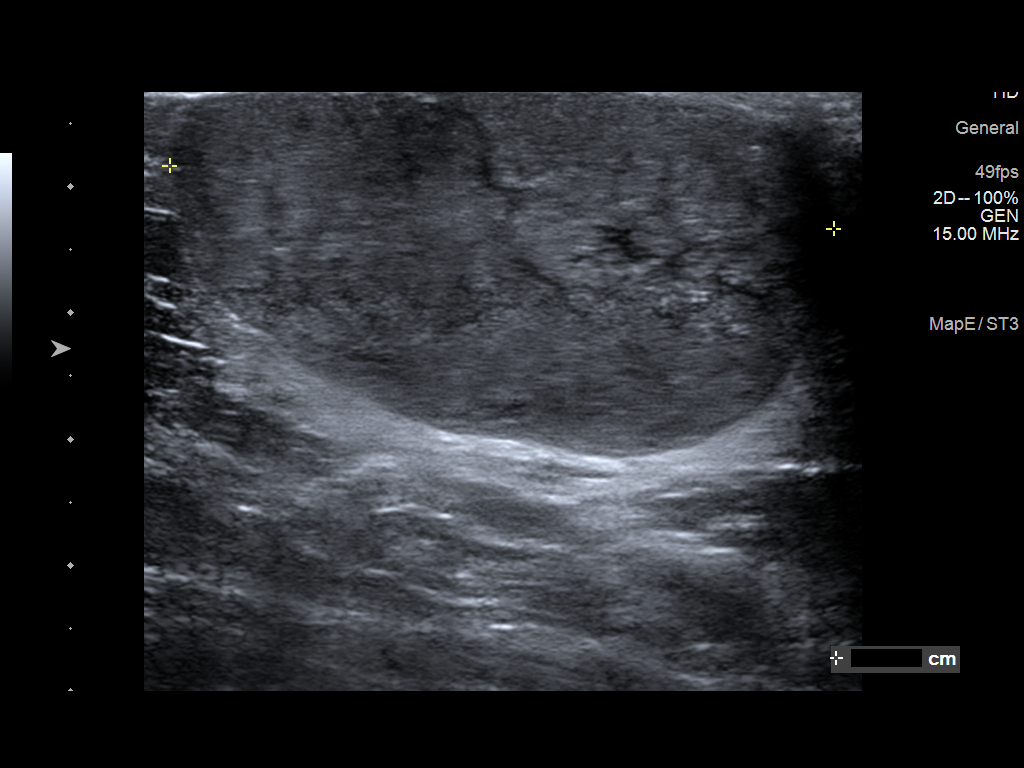
[im 5/6]
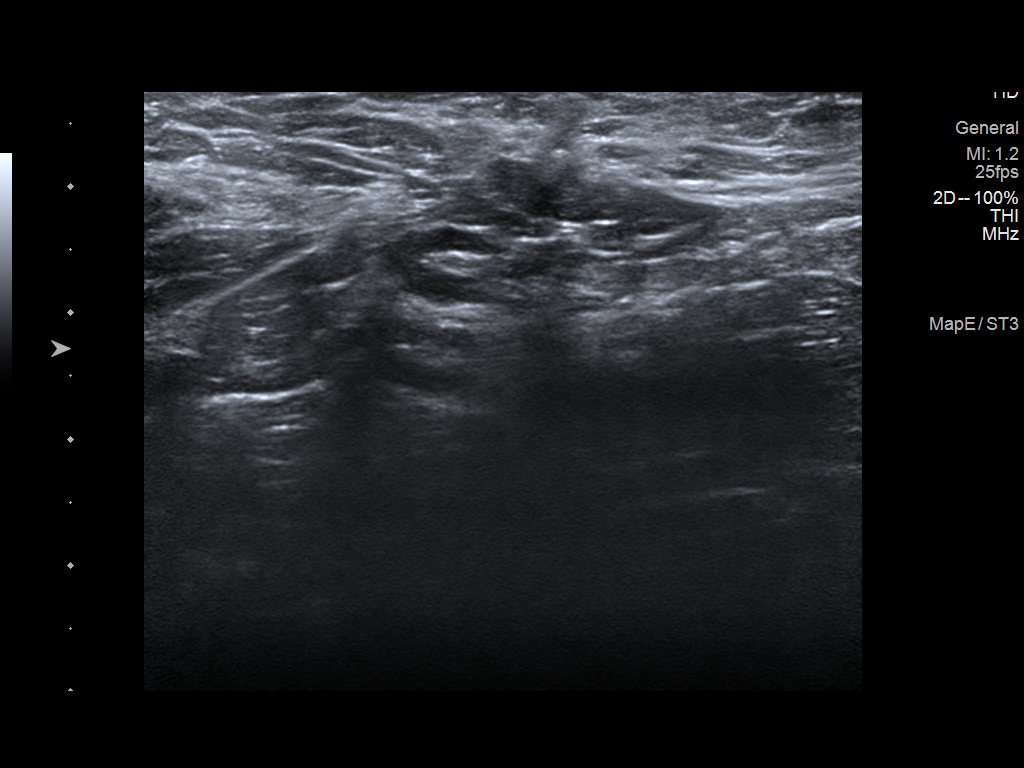
[im 6/6]
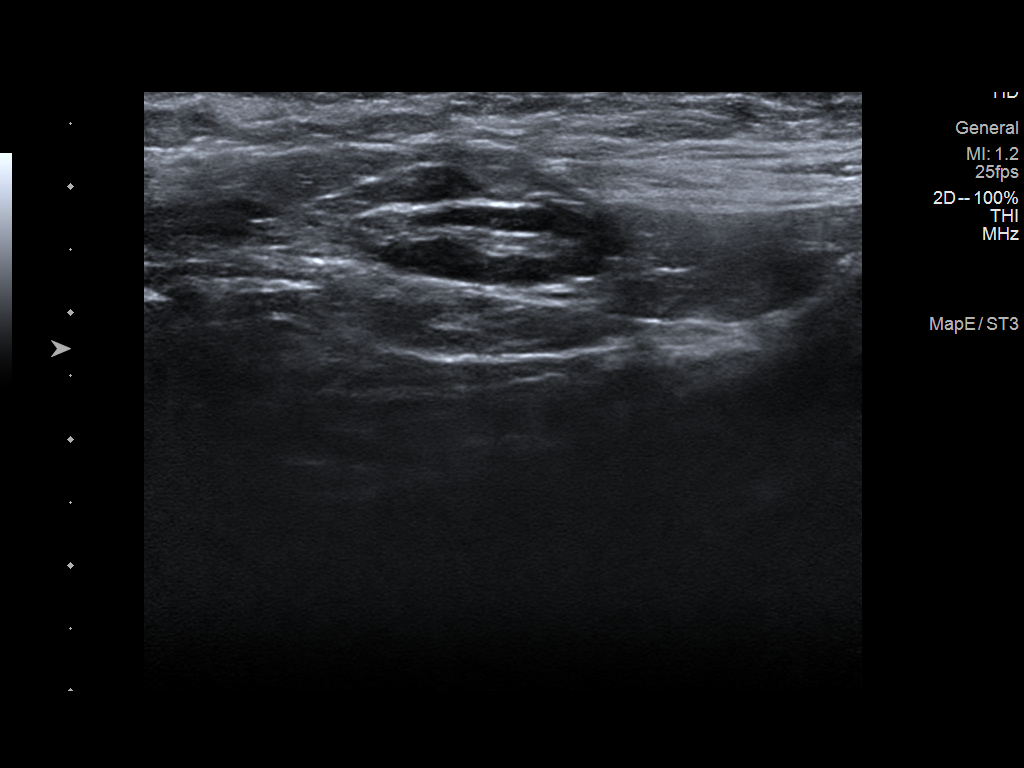

[6 of 6 positions shown; findings below may reference images not displayed]

LEFT breast ultrasound dated 05/01/2018 described a probably benign
fibroadenoma in the LEFT breast at the 7 o'clock axis measuring
x 2.4 x 4.4 cm. Follow-up LEFT breast ultrasound was recommended in
6 months. Patient returns today for the follow-up diagnostic
ultrasound.

EXAM:
ULTRASOUND OF THE LEFT BREAST
FINDINGS: Targeted ultrasound is performed, again showing an oval
circumscribed hypoechoic-to-isoechoic mass in the LEFT breast at the
7 o'clock axis, 2 cm from the nipple, now measuring 5.3 x 3 x 5 cm,
significantly enlarged compared to the previous ultrasound.
IMPRESSION: Significant interval enlargement of the LEFT breast mass at the 7
o'clock axis, now measuring 5.3 cm greatest dimension. This most
likely represents fibroadenoma or phyllodes. Recommend
ultrasound-guided biopsy to ensure benignity.

RECOMMENDATION:
Ultrasound-guided biopsy of the LEFT breast mass at the 7 o'clock
axis.

Ultrasound-guided biopsy is scheduled for [REDACTED].

I have discussed the findings and recommendations with the patient.
Results were also provided in writing at the conclusion of the
visit. If applicable, a reminder letter will be sent to the patient
regarding the next appointment.

BI-RADS CATEGORY  4: Suspicious.

## 2019-07-15 IMAGING — US US BREAST BX W LOC DEV 1ST LESION IMG BX SPEC US GUIDE*L*
1 series · 5 of 5 positions shown · non-contrast
Comparison: Previous exam(s).
COMPARISON: Previous exam(s).

Addendum:
CLINICAL DATA: 17-year-old female with an enlarging left breast
mass.

EXAM:
ULTRASOUND GUIDED LEFT BREAST CORE NEEDLE BIOPSY

[Series 1: us breast bx w loc dev 1st lesion img bx spec us g · 0.07mm/px · 5 of 5 slices shown]
[im 1/5]
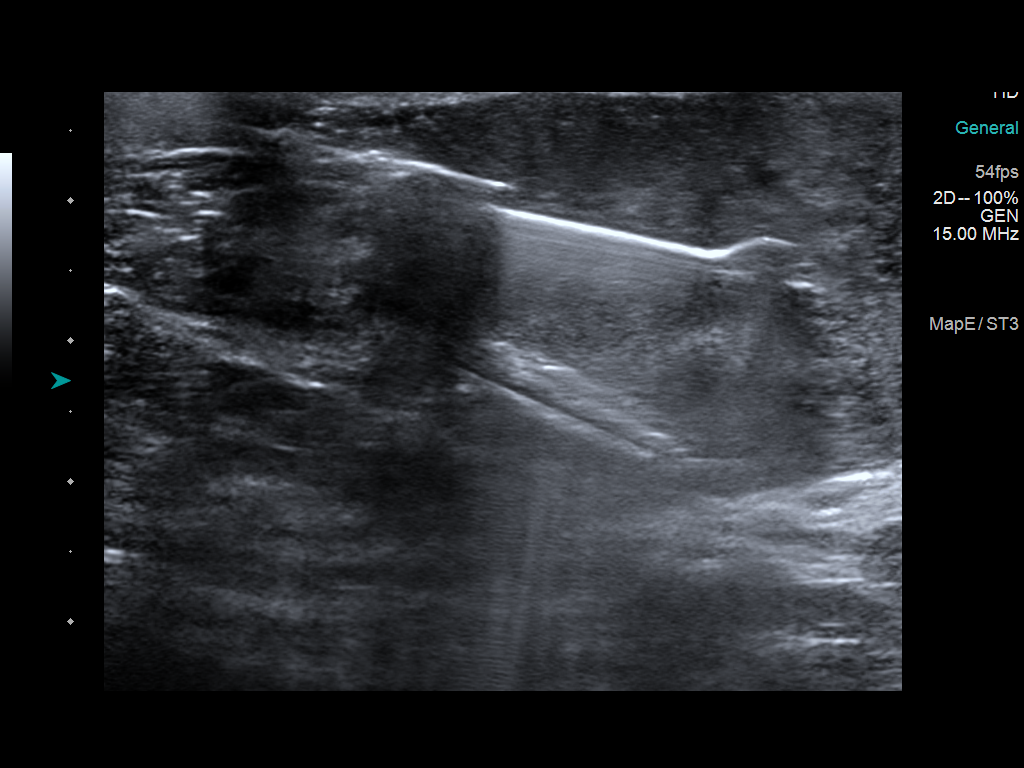
[im 2/5]
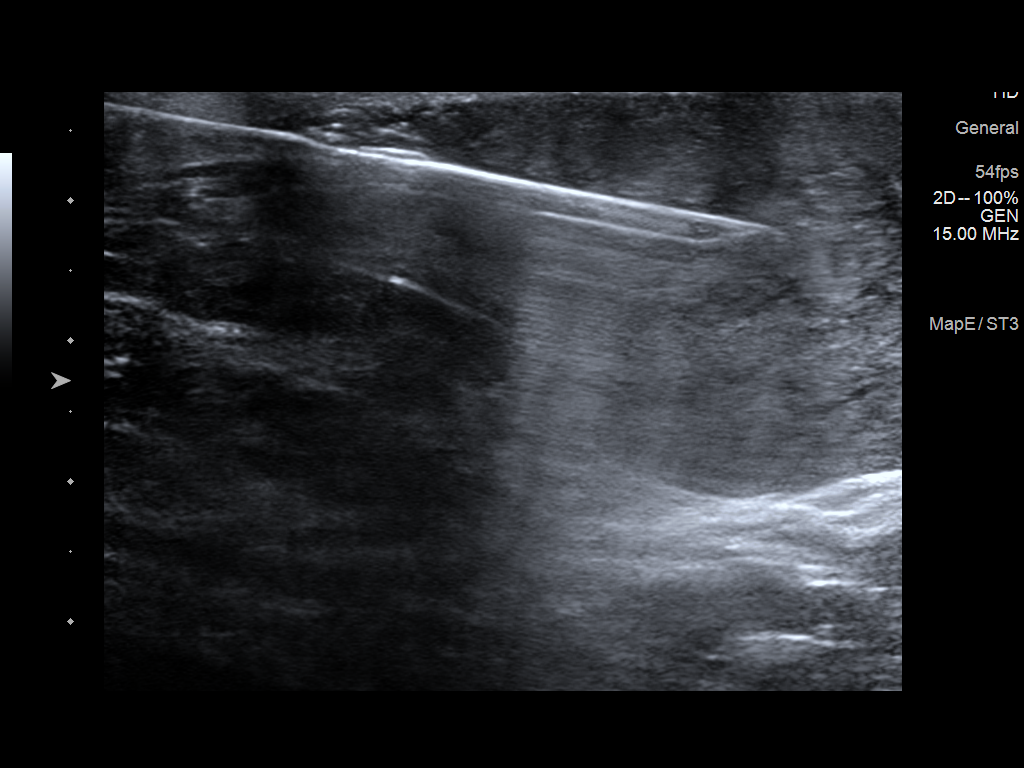
[im 3/5]
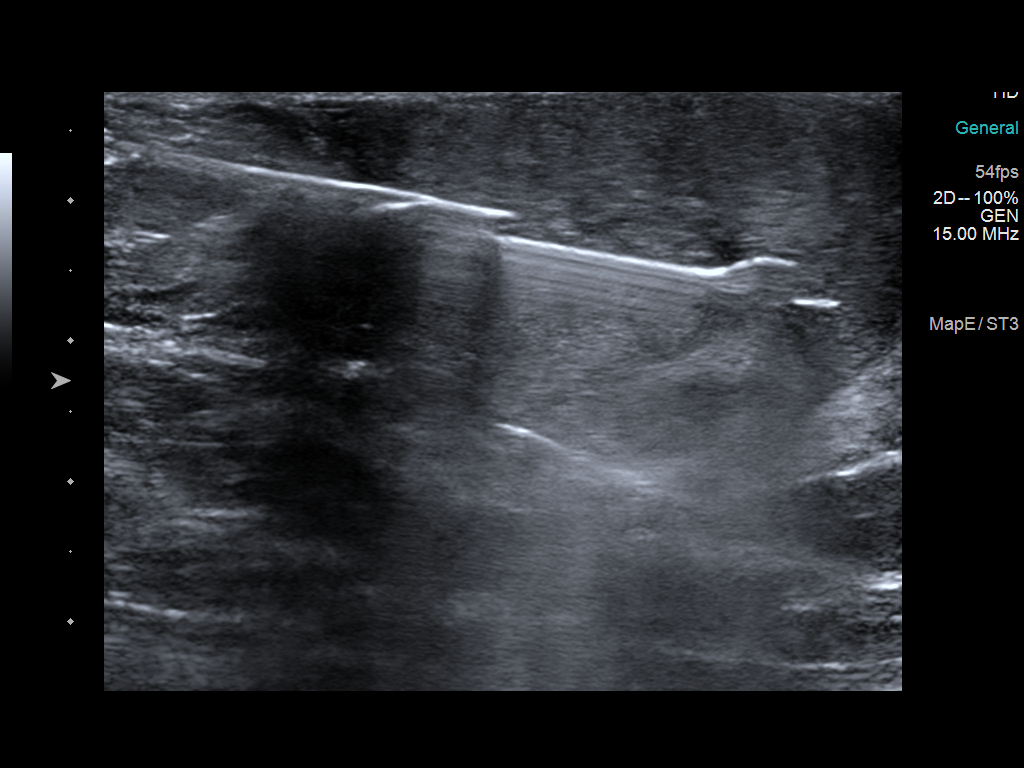
[im 4/5]
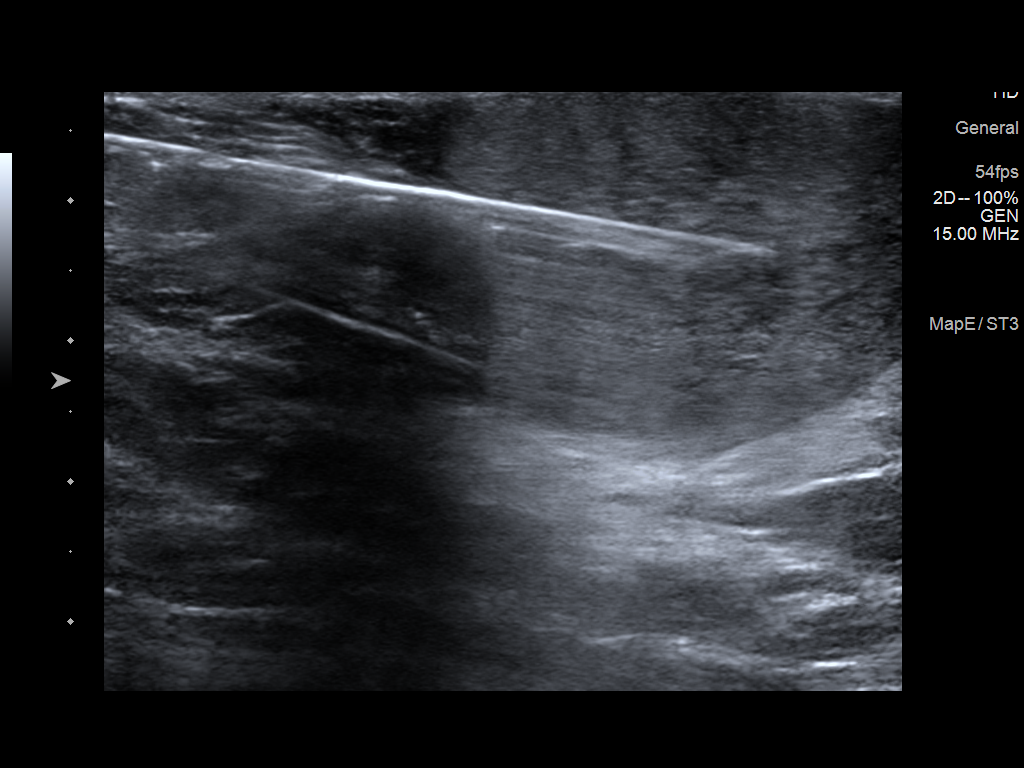
[im 5/5]
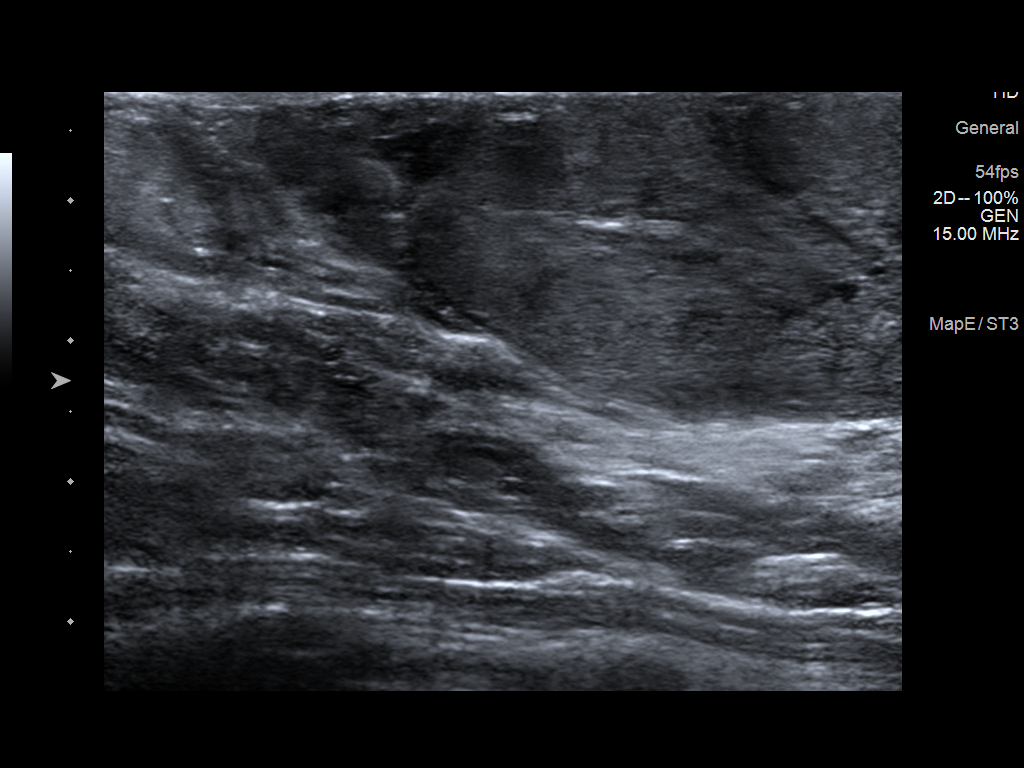

[5 of 5 positions shown; findings below may reference images not displayed]



Lesion quadrant: Lower inner quadrant

Using sterile technique and 1% Lidocaine as local anesthetic, under
direct ultrasound visualization, a 12 gauge Rosy device was
used to perform biopsy of a left breast mass using a inferior
approach. At the conclusion of the procedure a HydroMARK tissue
marker clip was deployed into the biopsy cavity. Post biopsy
mammogram was deferred.
IMPRESSION: Ultrasound guided biopsy of the left breast. No apparent
complications.

ADDENDUM:
Pathology revealed BENIGN BREAST PARENCHYMA WITH PSEUDOANGIOMATOUS
STROMAL HYPERPLASIA (PASH) of Left breast, 7 o'clock, 2 cm fn. The
findings likely represent a nodule forming PASH . This was found to
be concordant by Dr. Vakarugey Vernanda.

Pathology results were discussed with the patient by telephone. The
patient reported doing well after the biopsy with tenderness at the
site. Post biopsy instructions and care were reviewed and questions
were answered. The patient was encouraged to call The [REDACTED]

The patient was instructed to continue with monthly self breast
examinations, clinical follow-up as needed, and to return for annual
mammography at 38. The patient was informed a reminder notice would
be sent regarding this appointment.

The patient is scheduled for a bilateral breast reduction with Dr.
Nico Tyus on April 14, 2019.

Pathology results reported by [place] Maia Marques, RN on 03/05/2019.



Lesion quadrant: Lower inner quadrant

Using sterile technique and 1% Lidocaine as local anesthetic, under
direct ultrasound visualization, a 12 gauge Rosy device was
used to perform biopsy of a left breast mass using a inferior
approach. At the conclusion of the procedure a HydroMARK tissue
marker clip was deployed into the biopsy cavity. Post biopsy
mammogram was deferred.
IMPRESSION: Ultrasound guided biopsy of the left breast. No apparent
complications.

## 2019-08-11 MED FILL — FEMYNOR 0.25-35 MG-MCG TABS: 0.25-35 | 84 days supply | Qty: 84 | Fill #1

## 2019-08-23 ENCOUNTER — Telehealth: Payer: Self-pay

## 2019-08-23 NOTE — Telephone Encounter (Signed)

## 2019-08-24 ENCOUNTER — Encounter: Payer: Self-pay | Admitting: Plastic Surgery

## 2019-08-24 ENCOUNTER — Ambulatory Visit (INDEPENDENT_AMBULATORY_CARE_PROVIDER_SITE_OTHER): Payer: No Typology Code available for payment source | Admitting: Plastic Surgery

## 2019-08-24 ENCOUNTER — Other Ambulatory Visit: Payer: Self-pay

## 2019-08-24 VITALS — BP 132/86 | HR 82 | Temp 98.4°F | Ht 65.0 in | Wt 186.0 lb

## 2019-08-24 DIAGNOSIS — N62 Hypertrophy of breast: Secondary | ICD-10-CM

## 2019-08-24 MED FILL — FEMYNOR 0.25-35 MG-MCG TABS: 0.25-35 | 84 days supply | Qty: 84 | Fill #1

## 2019-08-24 NOTE — Progress Notes (Signed)
   Subjective:    Patient ID: Gabriella Phillips, female    DOB: 2001/09/10, 18 y.o.   MRN: [id number]  The patient is a 18 year old female here for follow-up on her bilateral breast reduction.  She is very pleased with her results overall.  Patient states she does not have neck or back pain anymore.  She is now wearing a regular bra yet. She is in a sports bra.  She has a little bit of tenderness and firmness in the superior pole of bilateral breasts.  It is mildly firm, overall feels very normal.  It is likely a little bit of fat necrosis.  She has not been massaging it as of yet.    Review of Systems  Constitutional: Negative for activity change and appetite change.  Eyes: Negative.   Respiratory: Negative.   Cardiovascular: Negative.   Gastrointestinal: Negative.   Musculoskeletal: Negative for back pain and neck pain.  Skin: Negative.   Hematological: Negative.        Objective:   Physical Exam Vitals signs and nursing note reviewed.  Constitutional:      Appearance: Normal appearance.  Cardiovascular:     Rate and Rhythm: Normal rate.  Pulmonary:     Effort: Pulmonary effort is normal.  Musculoskeletal: Normal range of motion.  Skin:    General: Skin is warm.  Neurological:     General: No focal deficit present.     Mental Status: She is alert and oriented to person, place, and time.  Psychiatric:        Mood and Affect: Mood normal.        Behavior: Behavior normal.        Thought Content: Thought content normal.         Assessment & Plan:     ICD-10-CM   1. Symptomatic mammary hypertrophy  N62    Massage to any areas that are slightly tender or firm.  I would like to see her back in 6 months for follow-up.  Call with any questions or concerns in the meantime. Pictures were obtained of the patient and placed in the chart with the patient's or guardian's permission.

## 2020-02-22 ENCOUNTER — Ambulatory Visit: Payer: No Typology Code available for payment source | Admitting: Plastic Surgery

## 2020-02-22 ENCOUNTER — Other Ambulatory Visit: Payer: Self-pay

## 2020-02-22 ENCOUNTER — Encounter: Payer: Self-pay | Admitting: Plastic Surgery

## 2020-02-22 DIAGNOSIS — Z9889 Other specified postprocedural states: Secondary | ICD-10-CM | POA: Insufficient documentation

## 2020-02-22 NOTE — Progress Notes (Signed)
   Subjective:    Patient ID: Gabriella Phillips, female    DOB: 09-07-2001, 19 y.o.   MRN: [id number]  The patient is a 19 year old black female here for follow up on her bilateral breast reduction from June.  She is very pleased with her size. She notices a significant improvement in her neck and back pain.  In the last few weeks she noticed a firm area on the left breast.  There is a 5 mm area of fat necrosis at the 4 o'clock position of the areola.  It is not tender.  There is no sign of infection.  She has not been massaging it as of yet.     Review of Systems  Constitutional: Negative.  Negative for activity change.  HENT: Negative.   Eyes: Negative.   Respiratory: Negative.   Cardiovascular: Negative.   Gastrointestinal: Negative.   Endocrine: Negative.   Genitourinary: Negative.   Musculoskeletal: Negative.   Hematological: Negative.   Psychiatric/Behavioral: Negative.        Objective:   Physical Exam Vitals and nursing note reviewed.  Constitutional:      Appearance: Normal appearance.  HENT:     Head: Normocephalic and atraumatic.  Cardiovascular:     Rate and Rhythm: Normal rate.     Pulses: Normal pulses.  Pulmonary:     Effort: Pulmonary effort is normal.  Chest:    Abdominal:     General: Abdomen is flat. There is no distension.     Tenderness: There is no abdominal tenderness.  Skin:    General: Skin is warm.  Neurological:     General: No focal deficit present.     Mental Status: She is alert and oriented to person, place, and time.  Psychiatric:        Mood and Affect: Mood normal.        Behavior: Behavior normal.        Thought Content: Thought content normal.         Assessment & Plan:     ICD-10-CM   1. S/P bilateral breast reduction  Z98.890     I encouraged her to start massaging the area.  I like to see if this will resolve with the massage.  If not we have the option of Kenalog and or excision.  A fine-needle biopsy is also an option  if it does not improve or gets bigger.  I would definitely like to see her back in 6 months for a reevaluation.  She is in agreement with that plan.  She is to call sooner if she has any questions or concerns.   Pictures were obtained of the patient and placed in the chart with the patient's or guardian's permission.

## 2020-05-19 ENCOUNTER — Other Ambulatory Visit: Payer: Self-pay | Admitting: Obstetrics and Gynecology

## 2020-05-19 DIAGNOSIS — N6320 Unspecified lump in the left breast, unspecified quadrant: Secondary | ICD-10-CM

## 2020-06-07 ENCOUNTER — Ambulatory Visit
Admission: RE | Admit: 2020-06-07 | Discharge: 2020-06-07 | Disposition: A | Discharge status: Home / Self Care | Payer: No Typology Code available for payment source | Source: Ambulatory Visit | Attending: Obstetrics and Gynecology | Admitting: Obstetrics and Gynecology

## 2020-06-07 ENCOUNTER — Other Ambulatory Visit: Payer: Self-pay | Admitting: Obstetrics and Gynecology

## 2020-06-07 ENCOUNTER — Other Ambulatory Visit: Payer: Self-pay

## 2020-06-07 DIAGNOSIS — N6320 Unspecified lump in the left breast, unspecified quadrant: Secondary | ICD-10-CM

## 2020-06-13 ENCOUNTER — Other Ambulatory Visit: Payer: No Typology Code available for payment source

## 2020-06-22 ENCOUNTER — Other Ambulatory Visit: Payer: Self-pay | Admitting: Obstetrics and Gynecology

## 2020-06-23 ENCOUNTER — Other Ambulatory Visit: Payer: No Typology Code available for payment source

## 2020-06-30 ENCOUNTER — Other Ambulatory Visit: Payer: Self-pay

## 2020-06-30 ENCOUNTER — Ambulatory Visit
Admission: RE | Admit: 2020-06-30 | Discharge: 2020-06-30 | Disposition: A | Discharge status: Home / Self Care | Payer: No Typology Code available for payment source | Source: Ambulatory Visit | Attending: Obstetrics and Gynecology | Admitting: Obstetrics and Gynecology

## 2020-06-30 DIAGNOSIS — N6320 Unspecified lump in the left breast, unspecified quadrant: Secondary | ICD-10-CM

## 2020-08-25 ENCOUNTER — Ambulatory Visit: Payer: No Typology Code available for payment source | Admitting: Plastic Surgery

## 2020-09-03 NOTE — Progress Notes (Deleted)
Patient is a 19 year old female here for follow-up after undergoing a bilateral breast reduction on 05/12/2019 with Dr. Marla Roe.  (Right reduction: 757 g, left reduction: 804 g)  At last visit on 3/23, she had a 5 mm area of fat necrosis at the 4 o'clock position of the areola of the left breast.  She was to do massage and if it remained could consider Kenalog injection or excision. I it does not improve or gets larger a fine needle biopsy may be considered.  ~ 1 yr 4 mo PO

## 2020-09-04 ENCOUNTER — Ambulatory Visit: Payer: No Typology Code available for payment source | Admitting: Plastic Surgery

## 2020-09-19 ENCOUNTER — Ambulatory Visit: Payer: No Typology Code available for payment source | Attending: Internal Medicine

## 2020-09-19 DIAGNOSIS — Z23 Encounter for immunization: Secondary | ICD-10-CM

## 2020-09-19 NOTE — Progress Notes (Signed)
   Covid-19 Vaccination Clinic  Name:  HARLOW BASLEY    MRN: [id number] DOB: 10-16-2001  09/19/2020  Ms. Heberlein was observed post Covid-19 immunization for 15 minutes without incident. She was provided with Vaccine Information Sheet and instruction to access the V-Safe system.   Ms. Putnam was instructed to call 911 with any severe reactions post vaccine: Marland Kitchen Difficulty breathing  . Swelling of face and throat  . A fast heartbeat  . A bad rash all over body  . Dizziness and weakness   Immunizations Administered    Name Date Dose VIS Date Route   Pfizer COVID-19 Vaccine 09/19/2020  9:06 AM 0.3 mL 01/26/2019 Intramuscular   Manufacturer: [employer]   Lot: [id number]   [place]: [phone]

## 2020-10-10 ENCOUNTER — Ambulatory Visit: Payer: No Typology Code available for payment source

## 2020-11-10 IMAGING — US US BREAST BX W LOC DEV 1ST LESION IMG BX SPEC US GUIDE*L*
1 series · 10 of 10 positions shown · non-contrast
Comparison: Previous exam(s).
COMPARISON: Previous exam(s).

Addendum:
CLINICAL DATA: 19-year-old with a palpable approximate 3.4 cm mass
involving the LOWER OUTER QUADRANT of the LEFT breast at the 4
o'clock position approximately 2 cm from nipple.

The patient had a benign core needle biopsy of a 5 cm mass in the
LOWER INNER QUADRANT of the LEFT breast in March 2019, pathology
revealing PASH. She had BILATERAL reduction mammoplasty in May 2019.
EXAM:
ULTRASOUND GUIDED LEFT BREAST CORE NEEDLE BIOPSY

[Series 1: us breast bx w loc dev 1st lesion img bx spec us g · 0.07mm/px · 10 of 10 slices shown]
[im 1/10]
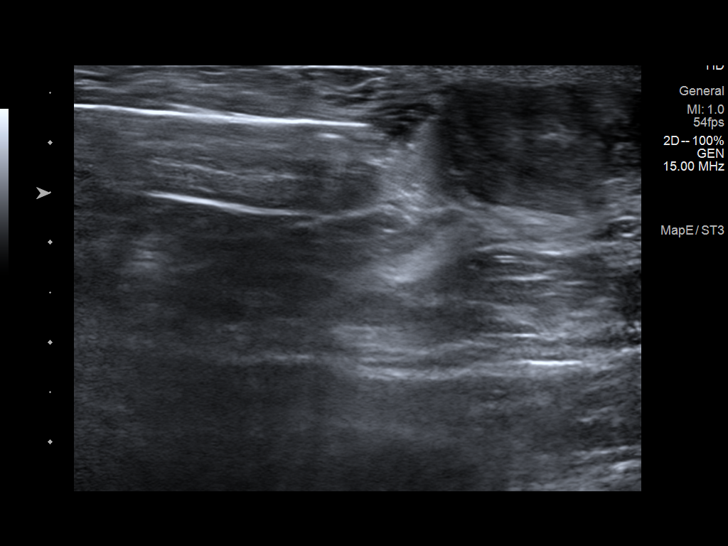
[im 2/10]
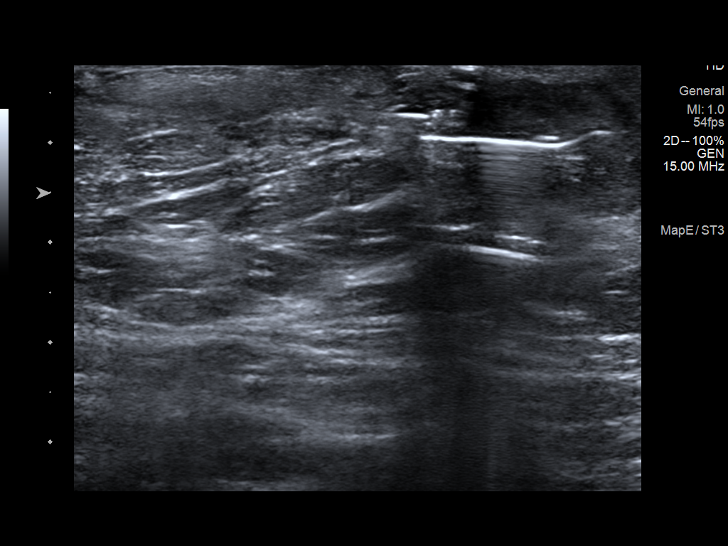
[im 3/10]
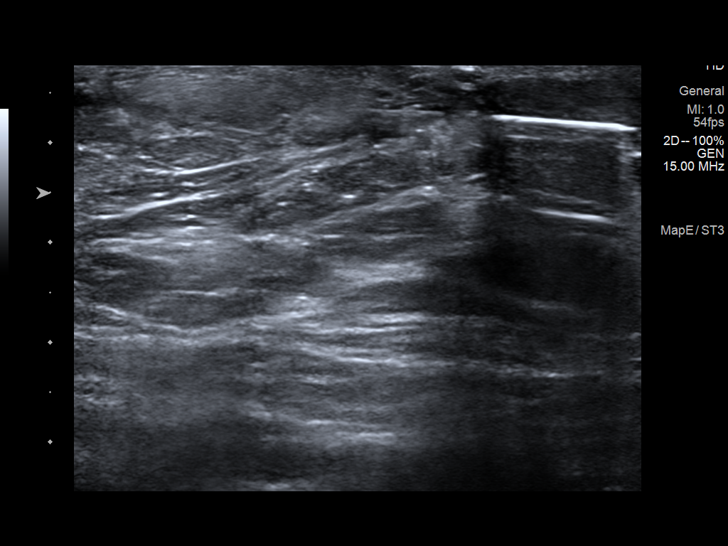
[im 4/10]
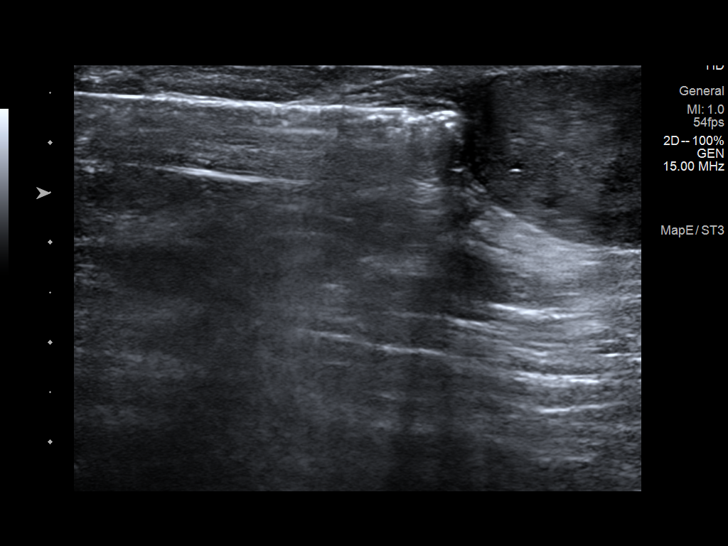
[im 5/10]
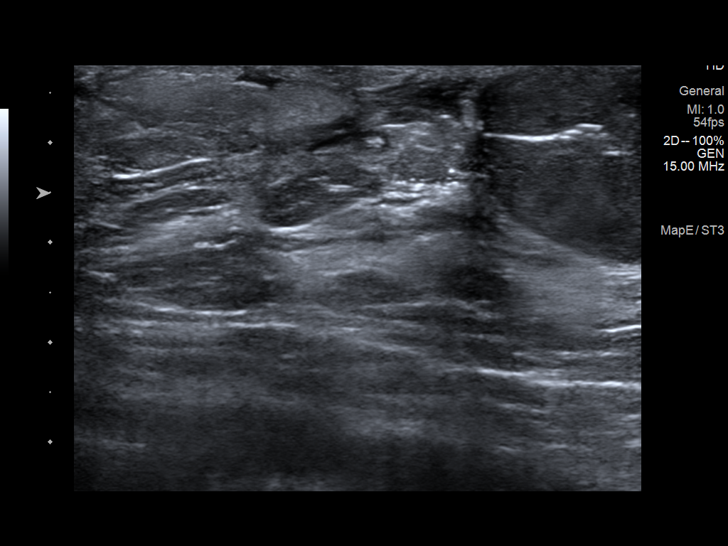
[im 6/10]
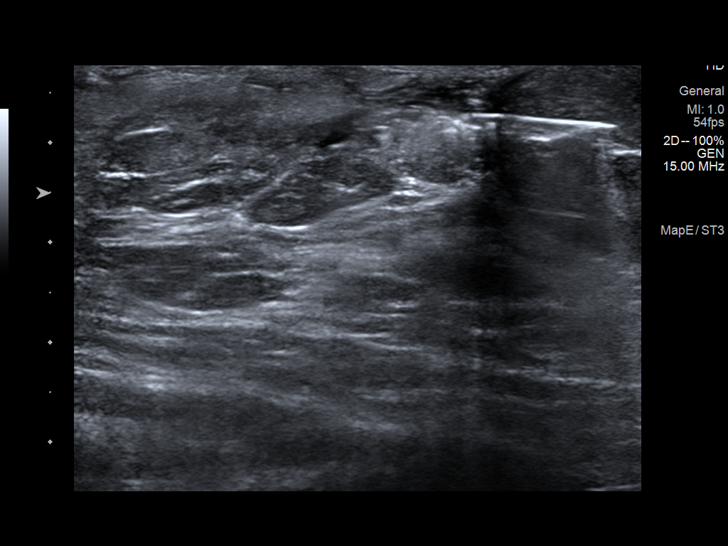
[im 7/10]
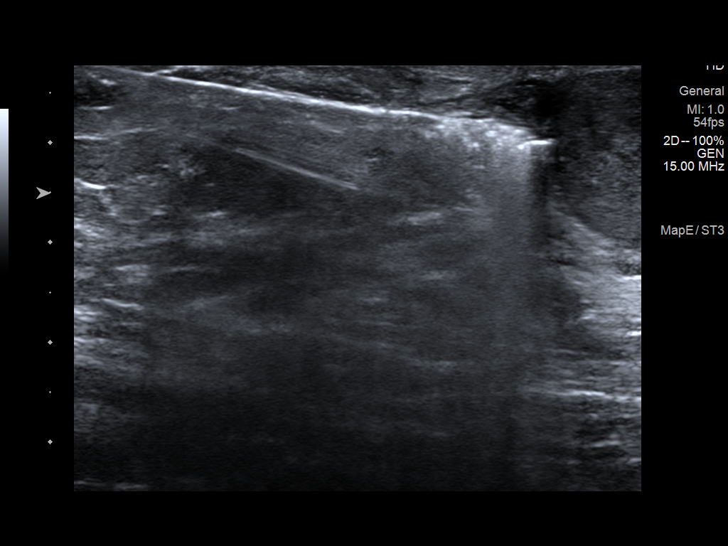
[im 8/10]
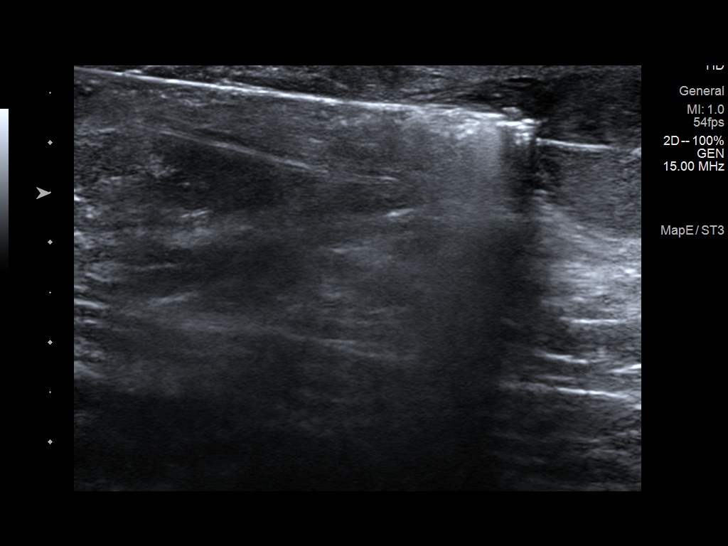
[im 9/10]
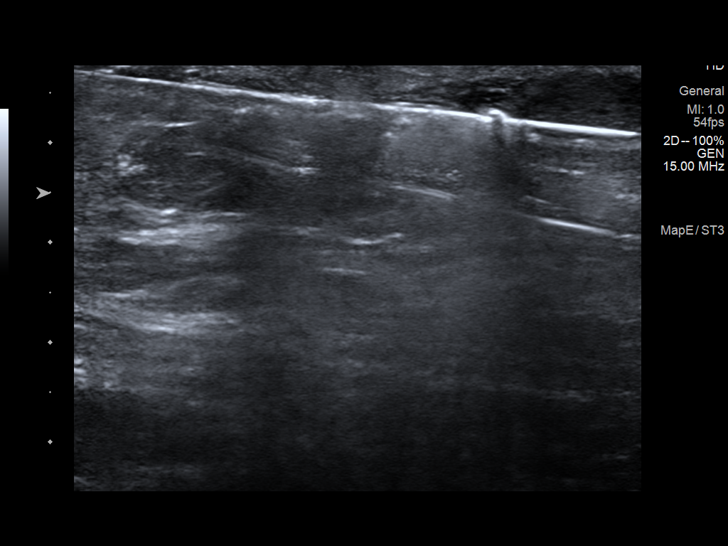
[im 10/10]
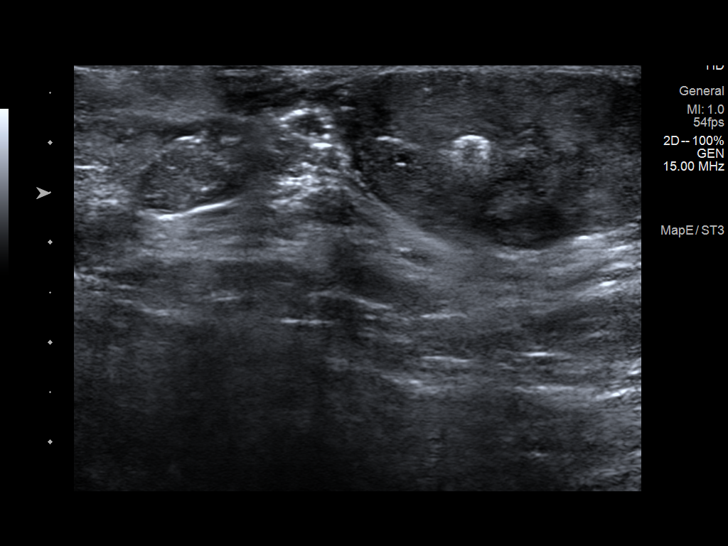

[10 of 10 positions shown; findings below may reference images not displayed]



Lesion quadrant: LOWER OUTER QUADRANT.

Using sterile technique with chlorhexidine as skin antisepsis, 1%
lidocaine and% lidocaine with epinephrine as local anesthetic, under
direct ultrasound visualization, a 12 gauge Mirza Jackman core needle
device placed through an 11 gauge introducer needle was used to
perform biopsy of the mass in the LOWER OUTER periareolar LEFT
breast using a lateral approach. At the conclusion of the procedure
a Q shaped tissue marker clip was deployed into the biopsy cavity.
IMPRESSION: Ultrasound guided biopsy of a palpable 3.4 cm mass involving the
LOWER OUTER QUADRANT of the LEFT breast. No apparent complications.

ADDENDUM:
Pathology revealed FIBROADENOMA of the LEFT breast, lower outer
quadrant, 4 o'clock, 0cmfn. This was found to be concordant by Dr.
Dewan Forbes.

Pathology results were discussed with the patient by telephone. The
patient reported doing well after the biopsy with tenderness at the
site. Post biopsy instructions and care were reviewed and questions
were answered. The patient was encouraged to call The [REDACTED]

The patient was instructed to continue with monthly self breast
examinations, clinical follow-up as needed, and to return for annual
mammography at 38.

Pathology results reported by Chirdon Plus RN on 07/03/2020.



Lesion quadrant: LOWER OUTER QUADRANT.

Using sterile technique with chlorhexidine as skin antisepsis, 1%
lidocaine and% lidocaine with epinephrine as local anesthetic, under
direct ultrasound visualization, a 12 gauge Mirza Jackman core needle
device placed through an 11 gauge introducer needle was used to
perform biopsy of the mass in the LOWER OUTER periareolar LEFT
breast using a lateral approach. At the conclusion of the procedure
a Q shaped tissue marker clip was deployed into the biopsy cavity.
IMPRESSION: Ultrasound guided biopsy of a palpable 3.4 cm mass involving the
LOWER OUTER QUADRANT of the LEFT breast. No apparent complications.

## 2020-11-21 ENCOUNTER — Other Ambulatory Visit: Payer: Self-pay | Admitting: Family Medicine

## 2020-11-21 ENCOUNTER — Telehealth (INDEPENDENT_AMBULATORY_CARE_PROVIDER_SITE_OTHER): Payer: No Typology Code available for payment source | Admitting: Family Medicine

## 2020-11-21 DIAGNOSIS — J029 Acute pharyngitis, unspecified: Secondary | ICD-10-CM | POA: Diagnosis not present

## 2020-11-21 MED ORDER — AMOXICILLIN 500 MG PO CAPS: 500.0000 mg | ORAL_CAPSULE | Freq: Two times a day (BID) | ORAL | 0 refills | Status: DC

## 2020-11-21 MED FILL — AMOXICILLIN 500 MG CAPSULE: 500 | 10 days supply | Qty: 20 | Fill #0

## 2020-11-21 NOTE — Progress Notes (Signed)
Virtual Visit via Video Note  I connected with Gabriella Phillips  on 11/21/20 at 11:00 AM EST by a video enabled telemedicine application and verified that I am speaking with the correct person using two identifiers.  Location patient: home, [place] Location provider:work or home office Persons participating in the virtual visit: patient, provider  I discussed the limitations of evaluation and management by telemedicine and the availability of in person appointments. The patient expressed understanding and agreed to proceed.   HPI:  Acute telemedicine visit for sore throat: -Onset: 4 days ago -Symptoms include: sore throat, white spots in the throat, swollen glands in the neck -no known sick contacts -Denies: denies fevers, malaise, inability to eat or drink, cough, nasal congestion, NVD, difficulty breathing or swallowing -Pertinent medication allergies:nkda -COVID-19 vaccine status: fully vaccinated, has not had booster -denies any close contact or kissing with anyone  ROS: See pertinent positives and negatives per HPI.  Past Medical History:  Diagnosis Date  . Macromastia     Past Surgical History:  Procedure Laterality Date  . BREAST REDUCTION SURGERY Bilateral 05/12/2019   Procedure: MAMMARY REDUCTION  (BREAST);  Surgeon: Wallace Going, DO;  Location: [facility];  Service: Plastics;  Laterality: Bilateral;  Please adjust timeframe to 4 hours (240 min)  . WISDOM TOOTH EXTRACTION       Current Outpatient Medications:  .  amoxicillin (AMOXIL) 500 MG capsule, Take 1 capsule (500 mg total) by mouth 2 (two) times daily., Disp: 20 capsule, Rfl: 0 .  norgestimate-ethinyl estradiol (ORTHO-CYCLEN,SPRINTEC,PREVIFEM) 0.25-35 MG-MCG tablet, Take 1 tablet by mouth daily., Disp: , Rfl:   EXAM:  VITALS per patient if applicable:  GENERAL: alert, oriented, appears well and in no acute distress  HEENT: atraumatic, conjunttiva clear, no obvious abnormalities on inspection of  external nose and ears, limited video visit exam but does appear to have some mild-mod tonsillar edema with erythema  NECK: normal movements of the head and neck, pt reports tender ant cervical LAD on self exam  LUNGS: on inspection no signs of respiratory distress, breathing rate appears normal, no obvious gross SOB, gasping or wheezing  CV: no obvious cyanosis  MS: moves all visible extremities without noticeable abnormality  PSYCH/NEURO: pleasant and cooperative, no obvious depression or anxiety, speech and thought processing grossly intact  ASSESSMENT AND PLAN:  Discussed the following assessment and plan:  Sore throat  -we discussed possible serious and likely etiologies, options for evaluation and workup, limitations of telemedicine visit vs in person visit, treatment, treatment risks and precautions. Pt prefers to treat via telemedicine empirically rather than in person at this moment.  Query strep pharyngitis vs viral pharyngitis vs other. Discussed options for further evaluation and advised inperson exam for strep/mono/covid testing would be best. However, she prefers to try empiric treatment with Amoxicillin 500mg  bid x 10 days for possible strep pharyngitis and agrees to stay home and get covid test. Understands risks. Agrees to seek prompt inperson care if worsening, new symptoms or not improving in 24-48 hours on abx. Work/School slipped offered:  declined  Discussed options for inperson care if PCP office not available. Did let this patient know that I only do telemedicine on Tuesdays and Thursdays for [place]. Advised to schedule follow up visit with PCP or [facility] if any further questions or concerns to avoid delays in care.   I discussed the assessment and treatment plan with the patient. The patient was provided an opportunity to ask questions and all were answered. The patient  agreed with the plan and demonstrated an understanding of the instructions.     Lucretia Kern, DO

## 2020-11-21 NOTE — Patient Instructions (Addendum)
-  I sent the medication(s) we discussed to your pharmacy: Meds ordered this encounter  Medications  . amoxicillin (AMOXIL) 500 MG capsule    Sig: Take 1 capsule (500 mg total) by mouth 2 (two) times daily.    Dispense:  20 capsule    Refill:  0   Please get a covid19 test as well and stay home while sick and until feeling better and your test results return. If test is positive, follow up with your primary care doctor and stay home for 10 days since the symptoms started.  I hope you are feeling better soon!  Seek in person care promptly if your symptoms worsen, new concerns arise or you are not improving with treatment.  It was nice to meet you today. I help [facility] out with telemedicine visits on Tuesdays and Thursdays and am available for visits on those days. If you have any concerns or questions following this visit please schedule a follow up visit with your Primary Care doctor or seek care at a local urgent care clinic to avoid delays in care.

## 2020-12-29 ENCOUNTER — Other Ambulatory Visit: Payer: Self-pay | Admitting: Family

## 2020-12-29 ENCOUNTER — Other Ambulatory Visit: Payer: Self-pay

## 2020-12-29 ENCOUNTER — Ambulatory Visit (INDEPENDENT_AMBULATORY_CARE_PROVIDER_SITE_OTHER): Payer: No Typology Code available for payment source | Admitting: Family

## 2020-12-29 ENCOUNTER — Encounter: Payer: Self-pay | Admitting: Family

## 2020-12-29 VITALS — BP 102/62 | HR 74 | Temp 98.1°F | Wt 191.0 lb

## 2020-12-29 DIAGNOSIS — H9202 Otalgia, left ear: Secondary | ICD-10-CM

## 2020-12-29 MED ORDER — NEOMYCIN-POLYMYXIN-HC 3.5-10000-1 OT SOLN
3.0000 [drp] | Freq: Three times a day (TID) | OTIC | 0 refills | Status: DC
Start: 2020-12-29 — End: 2020-12-29

## 2020-12-29 MED FILL — NEO/POLYMYXIN/HC EAR SOLN: 3.5-10000-1 | 22 days supply | Qty: 10 | Fill #0

## 2020-12-29 NOTE — Progress Notes (Signed)
  Gabriella Phillips is a 20 y.o. female with the following history as recorded in EpicCare:  Patient Active Problem List   Diagnosis Date Noted  . S/P bilateral breast reduction 02/22/2020  . Back pain 01/12/2019  . Neck pain 01/12/2019  . Symptomatic mammary hypertrophy 12/14/2018  . Routine general medical examination at a health care facility 12/14/2018  . Routine screening for STI (sexually transmitted infection) 12/14/2018  . Obesity (BMI 30.0-34.9) 12/14/2018    Current Outpatient Medications  Medication Sig Dispense Refill  . neomycin-polymyxin-hydrocortisone (CORTISPORIN) OTIC solution Place 3 drops into the left ear 3 (three) times daily. 10 mL 0   No current facility-administered medications for this visit.    Allergies: Patient has no known allergies.  Past Medical History:  Diagnosis Date  . Macromastia     Past Surgical History:  Procedure Laterality Date  . BREAST REDUCTION SURGERY Bilateral 05/12/2019   Procedure: MAMMARY REDUCTION  (BREAST);  Surgeon: Wallace Going, DO;  Location: [facility];  Service: Plastics;  Laterality: Bilateral;  Please adjust timeframe to 4 hours (240 min)  . WISDOM TOOTH EXTRACTION      No family history on file.  Social History   Tobacco Use  . Smoking status: Never Smoker  . Smokeless tobacco: Never Used  Substance Use Topics  . Alcohol use: Never    Subjective:   3 day history of left ear pain; started Tuesday night after using a Q-tip to clean out her ears; no other respiratory symptoms;  Objective:  Vitals:   12/29/20 1335  BP: 102/62  Pulse: 74  Temp: 98.1 F (36.7 C)  TempSrc: Oral  SpO2: 99%  Weight: 191 lb (86.6 kg)    General: Well developed, well nourished, in no acute distress  Skin : Warm and dry.  Head: Normocephalic and atraumatic  Eyes: Sclera and conjunctiva clear; pupils round and reactive to light; extraocular movements intact  Ears: External normal; canals clear; tympanic  membranes normal  Lungs: Respirations unlabored;  Neurologic: Alert and oriented; speech intact; face symmetrical; moves all extremities well; CNII-XII intact without focal deficit   Assessment:  1. Left ear pain     Plan:  Rx for Cortisporin Otic solution- use as directed; follow-up worse, no better.  This visit occurred during the SARS-CoV-2 public health emergency.  Safety protocols were in place, including screening questions prior to the visit, additional usage of staff PPE, and extensive cleaning of exam room while observing appropriate contact time as indicated for disinfecting solutions.     No follow-ups on file.  No orders of the defined types were placed in this encounter.   Requested Prescriptions   Signed Prescriptions Disp Refills  . neomycin-polymyxin-hydrocortisone (CORTISPORIN) OTIC solution 10 mL 0    Sig: Place 3 drops into the left ear 3 (three) times daily.

## 2021-05-23 ENCOUNTER — Other Ambulatory Visit: Payer: Self-pay | Admitting: Obstetrics and Gynecology

## 2021-05-23 DIAGNOSIS — N6320 Unspecified lump in the left breast, unspecified quadrant: Secondary | ICD-10-CM

## 2021-06-01 ENCOUNTER — Other Ambulatory Visit: Payer: No Typology Code available for payment source

## 2021-06-13 ENCOUNTER — Other Ambulatory Visit: Payer: Self-pay

## 2021-06-13 ENCOUNTER — Ambulatory Visit
Admission: RE | Admit: 2021-06-13 | Discharge: 2021-06-13 | Disposition: A | Discharge status: Home / Self Care | Payer: No Typology Code available for payment source | Source: Ambulatory Visit | Attending: Obstetrics and Gynecology | Admitting: Obstetrics and Gynecology

## 2021-06-13 DIAGNOSIS — N6320 Unspecified lump in the left breast, unspecified quadrant: Secondary | ICD-10-CM

## 2021-10-24 IMAGING — US US BREAST*L* LIMITED INC AXILLA
1 series · 7 of 7 positions shown · non-contrast
Comparison: Previous exam(s).

CLINICAL DATA: Request for follow-up of palpable mass in the LEFT
breast. Ultrasound-guided core biopsy of the mass was performed in
Monday June, 2020, demonstrating benign fibroadenoma. Patient reports no
change to prior exam. Patient had ultrasound-guided core biopsy of
mass in the 7 o'clock location of the LEFT breast, demonstrating
pseudoangiomatous stromal hyperplasia. Patient underwent bilateral
breast reduction in 7474.

EXAM:
ULTRASOUND OF THE LEFT BREAST

[Series 1: us breast*left* limited inc axilla · 0.07mm/px · 7 of 7 slices shown]
[im 1/7]
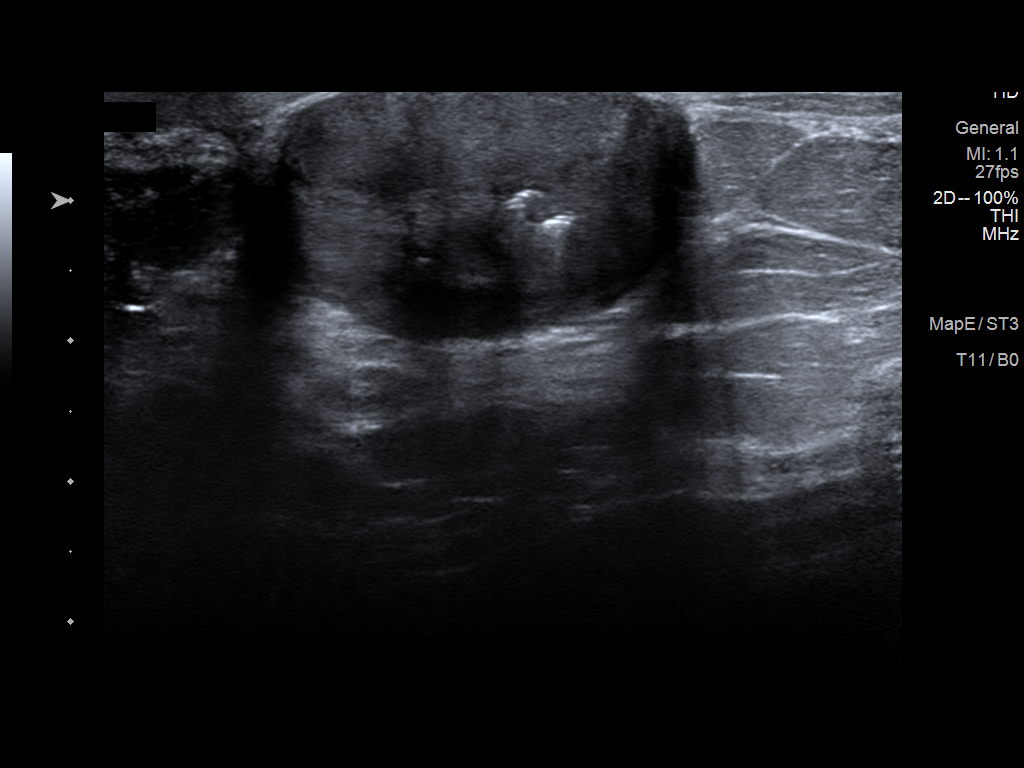
[im 2/7]
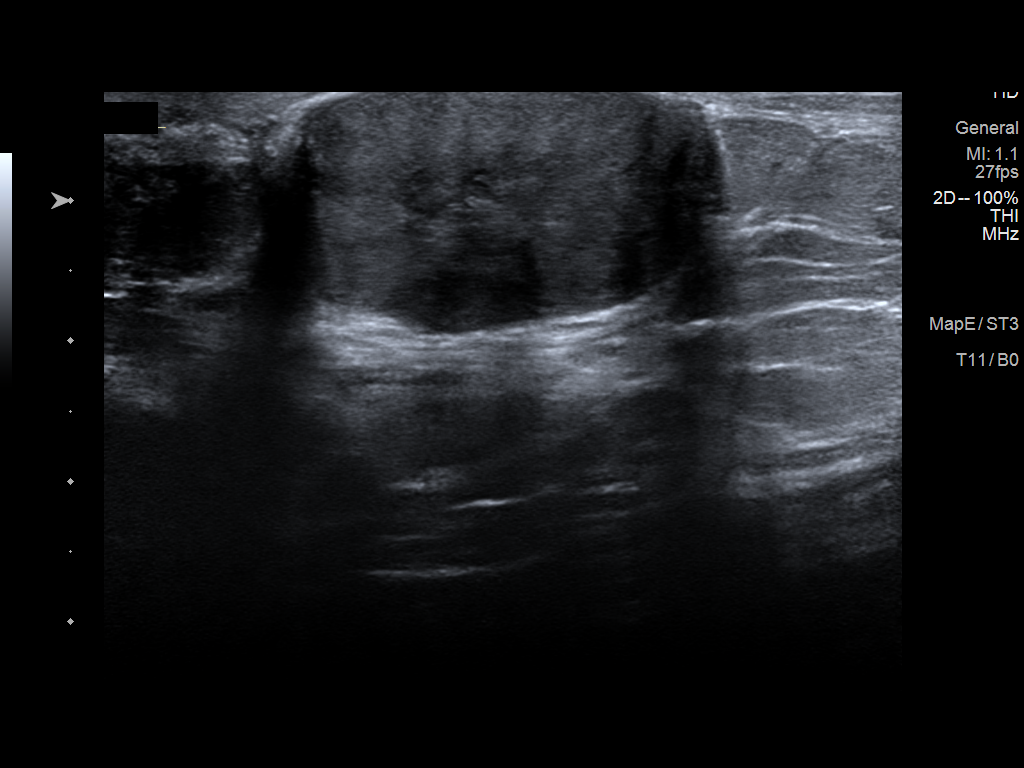
[im 3/7]
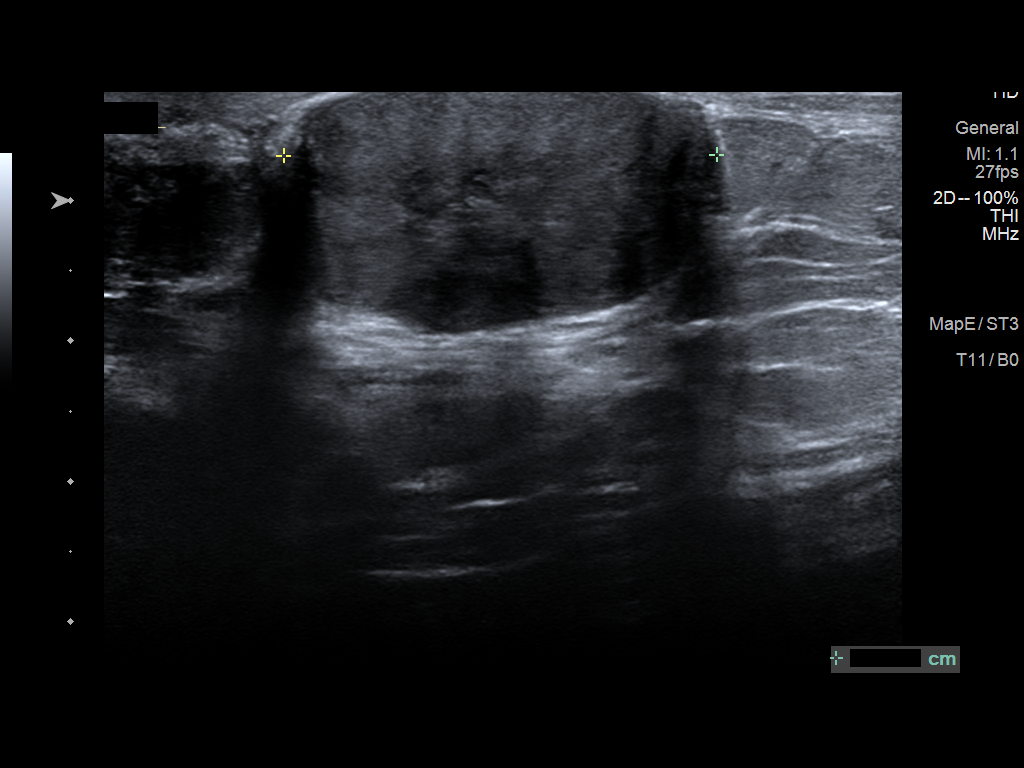
[im 4/7]
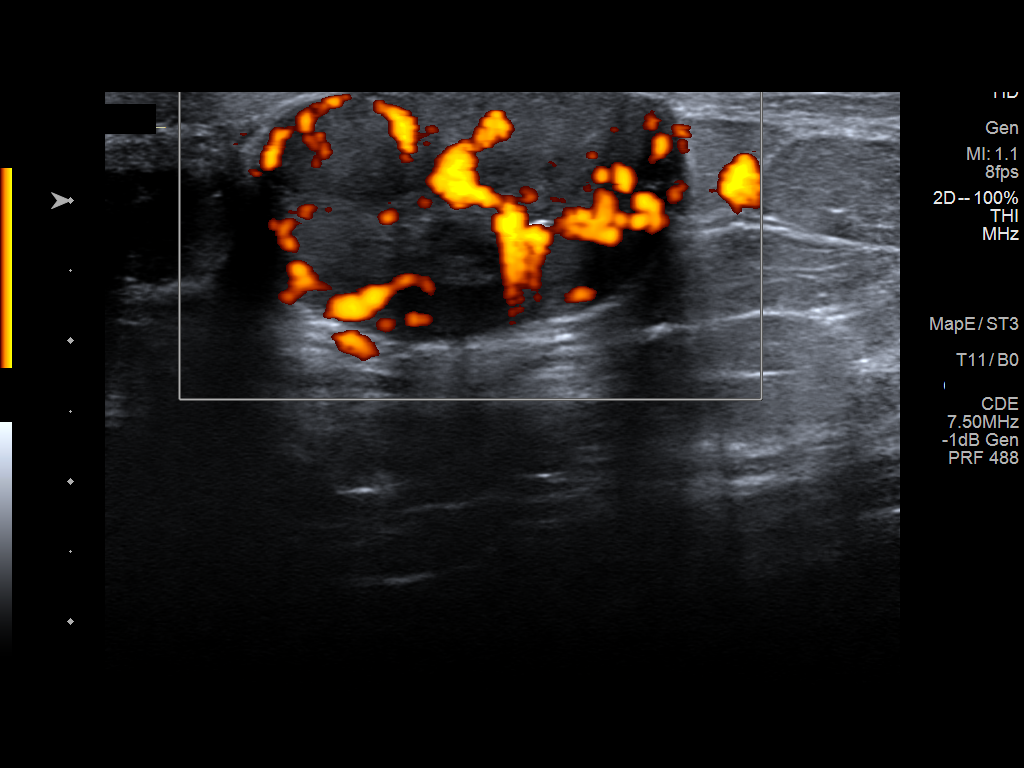
[im 5/7]
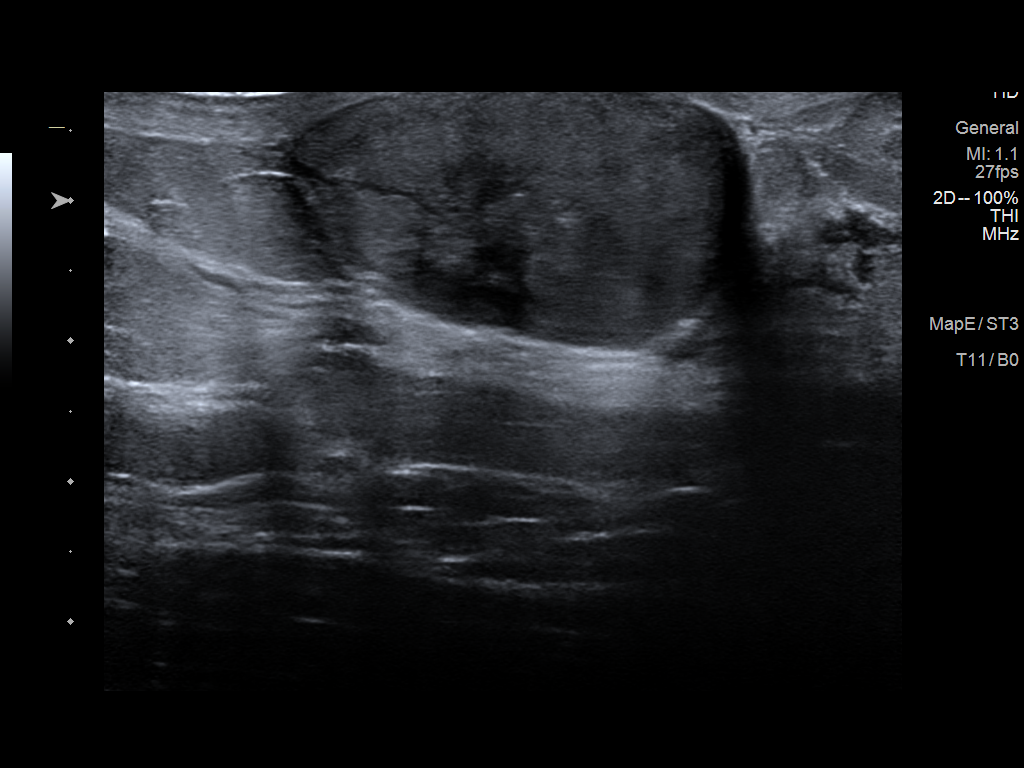
[im 6/7]
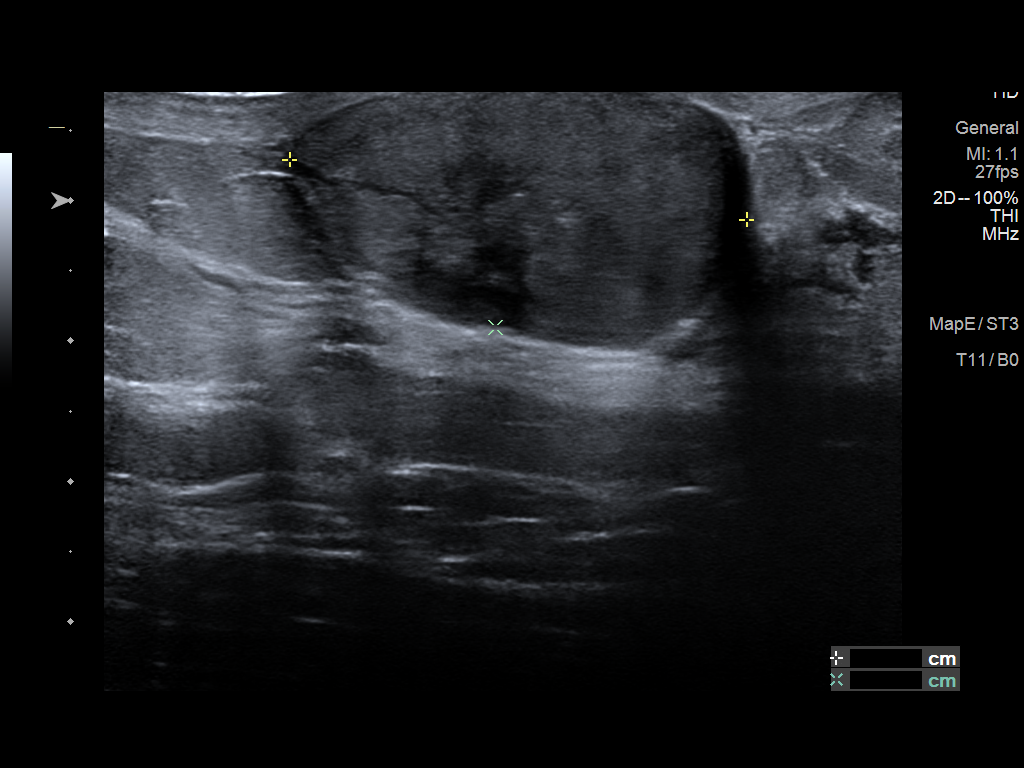
[im 7/7]
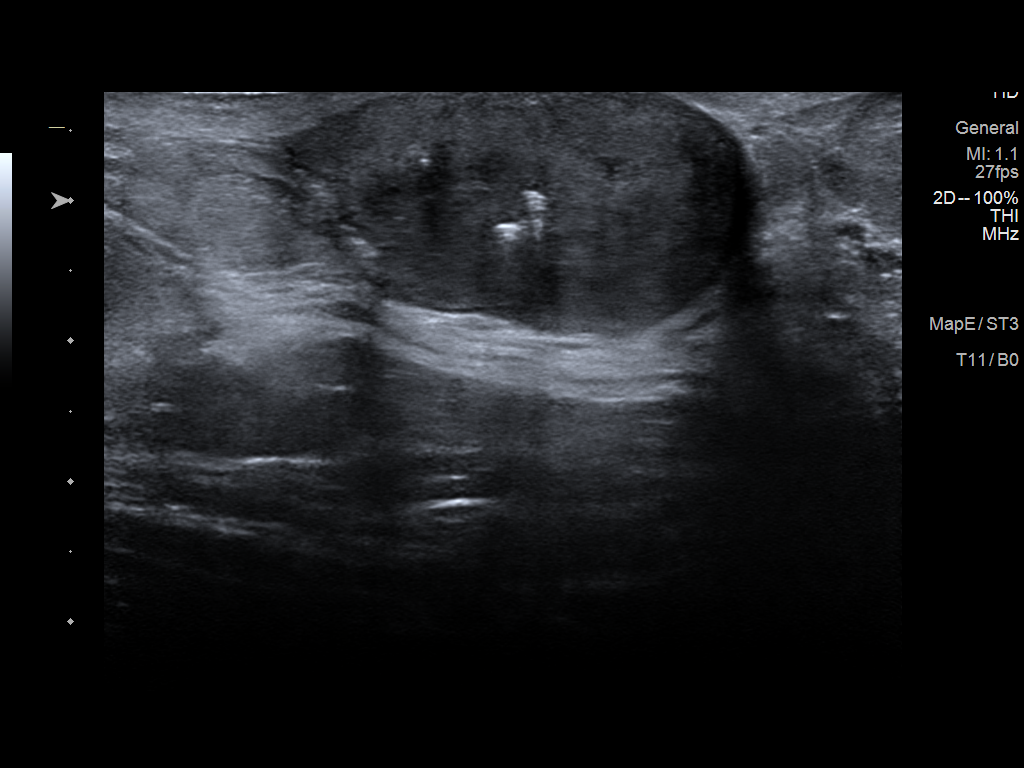

[7 of 7 positions shown; findings below may reference images not displayed]

FINDINGS: Targeted ultrasound is performed, showing a circumscribed oval
hypoechoic parallel mass posterior acoustic enhancement in the 4
o'clock location of the LEFT breast 2 centimeters from the nipple
which measures 3.1 x 3.3 x 1.8 centimeters. Previously mass measured
3.4 x 2.0 x 3.1 centimeters.
IMPRESSION: Stable biopsy-proven fibroadenoma in the 4 o'clock location of the
LEFT breast.

RECOMMENDATION:
Clinical follow-up, as needed. Screening mammogram at age 38 unless
there are persistent or intervening clinical concerns.
(Code:[id number]-[id number])

I have discussed the findings and recommendations with the patient.
If applicable, a reminder letter will be sent to the patient
regarding the next appointment.

BI-RADS CATEGORY  2: Benign.

## 2022-05-22 ENCOUNTER — Other Ambulatory Visit (HOSPITAL_COMMUNITY)
Admission: RE | Admit: 2022-05-22 | Discharge: 2022-05-22 | Disposition: A | Discharge status: Home / Self Care | Payer: No Typology Code available for payment source | Source: Ambulatory Visit | Attending: Obstetrics and Gynecology | Admitting: Obstetrics and Gynecology

## 2022-05-22 ENCOUNTER — Other Ambulatory Visit: Payer: Self-pay | Admitting: Obstetrics and Gynecology

## 2022-05-22 DIAGNOSIS — Z01419 Encounter for gynecological examination (general) (routine) without abnormal findings: Secondary | ICD-10-CM | POA: Diagnosis present

## 2022-05-28 LAB — CYTOLOGY - PAP
Comment: NEGATIVE
High risk HPV: NEGATIVE

## 2022-06-25 ENCOUNTER — Other Ambulatory Visit: Payer: Self-pay | Admitting: Obstetrics and Gynecology

## 2022-12-09 ENCOUNTER — Ambulatory Visit
Admission: RE | Admit: 2022-12-09 | Discharge: 2022-12-09 | Disposition: A | Discharge status: Home / Self Care | Payer: 59 | Source: Ambulatory Visit | Attending: Emergency Medicine | Admitting: Emergency Medicine

## 2022-12-09 ENCOUNTER — Other Ambulatory Visit (HOSPITAL_COMMUNITY): Payer: Self-pay

## 2022-12-09 VITALS — BP 112/77 | HR 111 | Temp 98.4°F | Resp 16

## 2022-12-09 DIAGNOSIS — B9689 Other specified bacterial agents as the cause of diseases classified elsewhere: Secondary | ICD-10-CM | POA: Diagnosis not present

## 2022-12-09 DIAGNOSIS — J038 Acute tonsillitis due to other specified organisms: Secondary | ICD-10-CM | POA: Diagnosis not present

## 2022-12-09 LAB — POCT RAPID STREP A (OFFICE): Rapid Strep A Screen: NEGATIVE

## 2022-12-09 MED ORDER — CEFDINIR 300 MG PO CAPS
300.0000 mg | ORAL_CAPSULE | Freq: Two times a day (BID) | ORAL | 0 refills | Status: AC
  Filled 2022-12-09: qty 20, 10d supply, fill #0

## 2022-12-09 NOTE — ED Triage Notes (Signed)
The patient c/o sore throat and white patches to back of throat. The patient states she does have some tightness felt in chest while breathing in. No SOB at this time.   Started: Saturday  Home interventions: OTC cough medications

## 2022-12-09 NOTE — Discharge Instructions (Signed)
Your strep test today is negative.  Streptococcal throat culture will be performed per our protocol, please keep in mind that the rapid strep test that we perform here at urgent care only catches 40% of strep throat infections.   Based on my physical exam findings and the history you provided to me today, I recommend that you begin antibiotics now for presumed strep throat instead of waiting for the strep culture result.  I have sent a prescription to your pharmacy.  After 24 hours of antibiotics, you should begin to feel significantly better.     After 24 hours of taking antibiotics, please discard your toothbrush as well as any other oral devices that you are currently using and replace them with new ones to avoid reinfection.   If your streptococcal throat culture has a negative result but you feel significantly better after taking antibiotics for 24 to 48 hours, I strongly recommend that you finish the full 10-day course.  Bacterial culture tests are only as reliable as the laboratory technician performing them.  Alternately, if your streptococcal throat culture has a negative result and you see no improvement of your symptoms after 24 to 48 hours of antibiotics, please discontinue the antibiotics as they are no longer indicated.  Your throat infection will then be most likely considered viral and will have to resolve on its own.  Please read below to learn more about the medications, dosages and frequencies that I recommend to help alleviate your symptoms and to get you feeling better soon:   Omnicef (cefdinir):  1 capsule twice daily for 10 days, you can take it with or without food.  This antibiotic can cause upset stomach, this will resolve once antibiotics are complete.  You are welcome to use a probiotic, eat yogurt, take Imodium while taking this medication.  Please avoid other systemic medications such as Maalox, Pepto-Bismol or milk of magnesia as they can interfere with your body's ability to  absorb the antibiotics.        Advil, Motrin (ibuprofen): This is a good anti-inflammatory medication which addresses aches, pains and inflammation of the upper airways that causes sinus and nasal congestion as well as in the lower airways which makes your cough feel tight and sometimes burn.  I recommend that you take between 400 to 600 mg every 6-8 hours as needed.      Please follow-up within the next 7-10 days either with your primary care provider or urgent care if your symptoms do not resolve.  If you do not have a primary care provider, we will assist you in finding one.        Thank you for visiting urgent care today.  We appreciate the opportunity to participate in your care.

## 2022-12-09 NOTE — ED Provider Notes (Signed)
[facility]    CSN: [id number] Arrival date & time: 12/09/22  1144    HISTORY   Chief Complaint  Patient presents with   Sore Throat    Also having chest pain - Entered by patient   HPI Gabriella Phillips is a pleasant, 22 y.o. female who presents to urgent care today. Patient complains of sore throat and a feeling of tightness in her chest when inhaling, denies shortness of breath however.  Patient states she has been taking over-the-counter cough medication without meaningful relief of her symptoms.  Patient states that she noticed this morning that she has white patches on the back of her throat.  Patient endorses pain with swallowing.  Patient denies difficulty maintaining secretions.  The history is provided by the patient.   Past Medical History:  Diagnosis Date   Macromastia    Patient Active Problem List   Diagnosis Date Noted   S/P bilateral breast reduction 02/22/2020   Back pain 01/12/2019   Neck pain 01/12/2019   Symptomatic mammary hypertrophy 12/14/2018   Routine general medical examination at a health care facility 12/14/2018   Routine screening for STI (sexually transmitted infection) 12/14/2018   Obesity (BMI 30.0-34.9) 12/14/2018   Past Surgical History:  Procedure Laterality Date   BREAST REDUCTION SURGERY Bilateral 05/12/2019   Procedure: MAMMARY REDUCTION  (BREAST);  Surgeon: Wallace Going, DO;  Location: [place];  Service: Plastics;  Laterality: Bilateral;  Please adjust timeframe to 4 hours (240 min)   WISDOM TOOTH EXTRACTION     OB History   No obstetric history on file.    Home Medications    Prior to Admission medications   Not on File    Family History History reviewed. No pertinent family history. Social History Social History   Tobacco Use   Smoking status: Never   Smokeless tobacco: Never  Vaping Use   Vaping Use: Never used  Substance Use Topics   Alcohol use: Never   Drug use: Never    Allergies   Patient has no known allergies.  Review of Systems Review of Systems Pertinent findings revealed after performing a 14 point review of systems has been noted in the history of present illness.  Physical Exam Triage Vital Signs ED Triage Vitals  Enc Vitals Group     BP 09/28/21 0827 (!) 147/82     Pulse Rate 09/28/21 0827 72     Resp 09/28/21 0827 18     Temp 09/28/21 0827 98.3 F (36.8 C)     Temp Source 09/28/21 0827 Oral     SpO2 09/28/21 0827 98 %     Weight --      Height --      Head Circumference --      Peak Flow --      Pain Score 09/28/21 0826 5     Pain Loc --      Pain Edu? --      Excl. in [place]? --   No data found.  Updated Vital Signs BP 112/77 (BP Location: Left Arm)   Pulse (!) 111   Temp 98.4 F (36.9 C) (Oral)   Resp 16   LMP 11/24/2022   SpO2 98%   Physical Exam Constitutional:      General: She is not in acute distress.    Appearance: She is well-developed. She is ill-appearing. She is not toxic-appearing.  HENT:     Head: Normocephalic and atraumatic.  Salivary Glands: Right salivary gland is diffusely enlarged. Right salivary gland is not tender. Left salivary gland is diffusely enlarged. Left salivary gland is not tender.     Right Ear: Hearing and external ear normal.     Left Ear: Hearing and external ear normal.     Ears:     Comments: Bilateral EACs with mild erythema, bilateral TMs are normal    Nose: No mucosal edema, congestion or rhinorrhea.     Right Turbinates: Not enlarged, swollen or pale.     Left Turbinates: Not enlarged or swollen.     Right Sinus: No maxillary sinus tenderness or frontal sinus tenderness.     Left Sinus: No maxillary sinus tenderness or frontal sinus tenderness.     Mouth/Throat:     Lips: Pink. No lesions.     Mouth: Mucous membranes are moist. No oral lesions or angioedema.     Dentition: No gingival swelling.     Tongue: No lesions.     Palate: No mass.     Pharynx: Uvula midline.  Pharyngeal swelling, oropharyngeal exudate and posterior oropharyngeal erythema present. No uvula swelling.     Tonsils: Tonsillar exudate present. 2+ on the right. 2+ on the left.  Eyes:     Extraocular Movements: Extraocular movements intact.     Conjunctiva/sclera: Conjunctivae normal.     Pupils: Pupils are equal, round, and reactive to light.  Neck:     Thyroid: No thyroid mass, thyromegaly or thyroid tenderness.     Trachea: Tracheal tenderness present. No abnormal tracheal secretions or tracheal deviation.     Comments: Voice is muffled Cardiovascular:     Rate and Rhythm: Normal rate and regular rhythm.     Pulses: Normal pulses.     Heart sounds: Normal heart sounds, S1 normal and S2 normal. No murmur heard.    No friction rub. No gallop.  Pulmonary:     Effort: Pulmonary effort is normal. No accessory muscle usage, prolonged expiration, respiratory distress or retractions.     Breath sounds: No stridor, decreased air movement or transmitted upper airway sounds. No decreased breath sounds, wheezing, rhonchi or rales.  Abdominal:     General: Bowel sounds are normal.     Palpations: Abdomen is soft.     Tenderness: There is generalized abdominal tenderness. There is no right CVA tenderness, left CVA tenderness or rebound. Negative signs include Murphy's sign.     Hernia: No hernia is present.  Musculoskeletal:        General: No tenderness. Normal range of motion.     Cervical back: Full passive range of motion without pain, normal range of motion and neck supple.     Right lower leg: No edema.     Left lower leg: No edema.  Lymphadenopathy:     Cervical: Cervical adenopathy present.     Right cervical: Superficial cervical adenopathy present.     Left cervical: Superficial cervical adenopathy present.  Skin:    General: Skin is warm and dry.     Findings: No erythema, lesion or rash.  Neurological:     General: No focal deficit present.     Mental Status: She is alert  and oriented to person, place, and time. Mental status is at baseline.  Psychiatric:        Mood and Affect: Mood normal.        Behavior: Behavior normal.        Thought Content: Thought content normal.  Judgment: Judgment normal.     Visual Acuity Right Eye Distance:   Left Eye Distance:   Bilateral Distance:    Right Eye Near:   Left Eye Near:    Bilateral Near:     UC Couse / Diagnostics / Procedures:     Radiology No results found.  Procedures Procedures (including critical care time) EKG  Pending results:  Labs Reviewed  CULTURE, GROUP A STREP [facility])  POCT RAPID STREP A (OFFICE)    Medications Ordered in UC: Medications - No data to display  UC Diagnoses / Final Clinical Impressions(s)   I have reviewed the triage vital signs and the nursing notes.  Pertinent labs & imaging results that were available during my care of the patient were reviewed by me and considered in my medical decision making (see chart for details).    Final diagnoses:  Acute bacterial tonsillitis   Rapid strep test today was negative, strep culture pending.  Based on acute onset of symptoms and physical exam findings, will treat patient empirically for presumed bacterial tonsillitis with a 10-day course of cefdinir.  Return precautions advised. Please see discharge instructions below for further details of plan of care as provided to patient. ED Prescriptions     Medication Sig Dispense Auth. Provider   cefdinir (OMNICEF) 300 MG capsule Take 1 capsule (300 mg total) by mouth 2 (two) times daily for 10 days. 20 capsule Lynden Oxford Scales, PA-C      PDMP not reviewed this encounter.  Disposition Upon Discharge:  Condition: stable for discharge home Home: take medications as prescribed; routine discharge instructions as discussed; follow up as advised.  Patient presented with an acute illness with associated systemic symptoms and significant discomfort requiring urgent  management. In my opinion, this is a condition that a prudent lay person (someone who possesses an average knowledge of health and medicine) may potentially expect to result in complications if not addressed urgently such as respiratory distress, impairment of bodily function or dysfunction of bodily organs.   Routine symptom specific, illness specific and/or disease specific instructions were discussed with the patient and/or caregiver at length.   As such, the patient has been evaluated and assessed, work-up was performed and treatment was provided in alignment with urgent care protocols and evidence based medicine.  Patient/parent/caregiver has been advised that the patient may require follow up for further testing and treatment if the symptoms continue in spite of treatment, as clinically indicated and appropriate.  If the patient was tested for COVID-19, Influenza and/or RSV, then the patient/parent/guardian was advised to isolate at home pending the results of his/her diagnostic coronavirus test and potentially longer if they're positive. I have also advised pt that if his/her COVID-19 test returns positive, it's recommended to self-isolate for at least 10 days after symptoms first appeared AND until fever-free for 24 hours without fever reducer AND other symptoms have improved or resolved. Discussed self-isolation recommendations as well as instructions for household member/close contacts as per the [facility] and [place] DHHS, and also gave patient the [place] packet with this information.  Patient/parent/caregiver has been advised to return to the [facility] or PCP in 3-5 days if no better; to PCP or the Emergency Department if new signs and symptoms develop, or if the current signs or symptoms continue to change or worsen for further workup, evaluation and treatment as clinically indicated and appropriate  The patient will follow up with their current PCP if and as advised. If the patient does not currently  have a  PCP we will assist them in obtaining one.   The patient may need specialty follow up if the symptoms continue, in spite of conservative treatment and management, for further workup, evaluation, consultation and treatment as clinically indicated and appropriate.  Patient/parent/caregiver verbalized understanding and agreement of plan as discussed.  All questions were addressed during visit.  Please see discharge instructions below for further details of plan.  Discharge Instructions:   Discharge Instructions      Your strep test today is negative.  Streptococcal throat culture will be performed per our protocol, please keep in mind that the rapid strep test that we perform here at urgent care only catches 40% of strep throat infections.   Based on my physical exam findings and the history you provided to me today, I recommend that you begin antibiotics now for presumed strep throat instead of waiting for the strep culture result.  I have sent a prescription to your pharmacy.  After 24 hours of antibiotics, you should begin to feel significantly better.     After 24 hours of taking antibiotics, please discard your toothbrush as well as any other oral devices that you are currently using and replace them with new ones to avoid reinfection.   If your streptococcal throat culture has a negative result but you feel significantly better after taking antibiotics for 24 to 48 hours, I strongly recommend that you finish the full 10-day course.  Bacterial culture tests are only as reliable as the laboratory technician performing them.  Alternately, if your streptococcal throat culture has a negative result and you see no improvement of your symptoms after 24 to 48 hours of antibiotics, please discontinue the antibiotics as they are no longer indicated.  Your throat infection will then be most likely considered viral and will have to resolve on its own.  Please read below to learn more about the medications,  dosages and frequencies that I recommend to help alleviate your symptoms and to get you feeling better soon:   Omnicef (cefdinir):  1 capsule twice daily for 10 days, you can take it with or without food.  This antibiotic can cause upset stomach, this will resolve once antibiotics are complete.  You are welcome to use a probiotic, eat yogurt, take Imodium while taking this medication.  Please avoid other systemic medications such as Maalox, Pepto-Bismol or milk of magnesia as they can interfere with your body's ability to absorb the antibiotics.        Advil, Motrin (ibuprofen): This is a good anti-inflammatory medication which addresses aches, pains and inflammation of the upper airways that causes sinus and nasal congestion as well as in the lower airways which makes your cough feel tight and sometimes burn.  I recommend that you take between 400 to 600 mg every 6-8 hours as needed.      Please follow-up within the next 7-10 days either with your primary care provider or urgent care if your symptoms do not resolve.  If you do not have a primary care provider, we will assist you in finding one.        Thank you for visiting urgent care today.  We appreciate the opportunity to participate in your care.       This office note has been dictated using Museum/gallery curator.  Unfortunately, this method of dictation can sometimes lead to typographical or grammatical errors.  I apologize for your inconvenience in advance if this occurs.  Please do not  hesitate to reach out to me if clarification is needed.      Lynden Oxford Scales, PA-C 12/09/22 [phone]

## 2022-12-12 LAB — CULTURE, GROUP A STREP (THRC)

## 2023-01-22 ENCOUNTER — Ambulatory Visit: Payer: 59 | Admitting: Family Medicine

## 2023-03-25 DIAGNOSIS — Z113 Encounter for screening for infections with a predominantly sexual mode of transmission: Secondary | ICD-10-CM | POA: Diagnosis not present

## 2023-03-25 DIAGNOSIS — N898 Other specified noninflammatory disorders of vagina: Secondary | ICD-10-CM | POA: Diagnosis not present

## 2023-03-27 ENCOUNTER — Ambulatory Visit (INDEPENDENT_AMBULATORY_CARE_PROVIDER_SITE_OTHER): Payer: 59 | Admitting: Family Medicine

## 2023-03-27 ENCOUNTER — Encounter: Payer: Self-pay | Admitting: Family Medicine

## 2023-03-27 ENCOUNTER — Other Ambulatory Visit (HOSPITAL_COMMUNITY): Payer: Self-pay

## 2023-03-27 VITALS — BP 110/66 | HR 73 | Temp 97.6°F | Ht 65.0 in | Wt 201.0 lb

## 2023-03-27 DIAGNOSIS — R7303 Prediabetes: Secondary | ICD-10-CM | POA: Diagnosis not present

## 2023-03-27 DIAGNOSIS — E669 Obesity, unspecified: Secondary | ICD-10-CM | POA: Diagnosis not present

## 2023-03-27 DIAGNOSIS — E7800 Pure hypercholesterolemia, unspecified: Secondary | ICD-10-CM | POA: Diagnosis not present

## 2023-03-27 DIAGNOSIS — Z0001 Encounter for general adult medical examination with abnormal findings: Secondary | ICD-10-CM | POA: Diagnosis not present

## 2023-03-27 LAB — COMPREHENSIVE METABOLIC PANEL
ALT: 10 U/L (ref 0–35)
AST: 14 U/L (ref 0–37)
Albumin: 3.8 g/dL (ref 3.5–5.2)
Alkaline Phosphatase: 69 U/L (ref 39–117)
BUN: 7 mg/dL (ref 6–23)
CO2: 26 mEq/L (ref 19–32)
Calcium: 9.2 mg/dL (ref 8.4–10.5)
Chloride: 106 mEq/L (ref 96–112)
Creatinine, Ser: 0.78 mg/dL (ref 0.40–1.20)
GFR: 106.81 mL/min (ref >60.00)
Glucose, Bld: 86 mg/dL (ref 70–99)
Potassium: 3.9 mEq/L (ref 3.5–5.1)
Sodium: 139 mEq/L (ref 135–145)
Total Bilirubin: 0.3 mg/dL (ref 0.2–1.2)
Total Protein: 7 g/dL (ref 6.0–8.3)

## 2023-03-27 LAB — CBC WITH DIFFERENTIAL/PLATELET
Basophils Absolute: 0 10*3/uL (ref 0.0–0.1)
Basophils Relative: 0.2 % (ref 0.0–3.0)
Eosinophils Absolute: 0 10*3/uL (ref 0.0–0.7)
Eosinophils Relative: 0.6 % (ref 0.0–5.0)
HCT: 35.2 % — ABNORMAL LOW (ref 36.0–46.0)
Hemoglobin: 11.6 g/dL — ABNORMAL LOW (ref 12.0–15.0)
Lymphocytes Relative: 24.3 % (ref 12.0–46.0)
Lymphs Abs: 1.7 10*3/uL (ref 0.7–4.0)
MCHC: 33 g/dL (ref 30.0–36.0)
MCV: 84.1 fl (ref 78.0–100.0)
Monocytes Absolute: 0.7 10*3/uL (ref 0.1–1.0)
Monocytes Relative: 9.1 % (ref 3.0–12.0)
Neutro Abs: 4.7 10*3/uL (ref 1.4–7.7)
Neutrophils Relative %: 65.8 % (ref 43.0–77.0)
Platelets: 327 10*3/uL (ref 150.0–400.0)
RBC: 4.18 Mil/uL (ref 3.87–5.11)
RDW: 14.6 % (ref 11.5–15.5)
WBC: 7.2 10*3/uL (ref 4.0–10.5)

## 2023-03-27 LAB — T4, FREE: Free T4: 0.76 ng/dL (ref 0.60–1.60)

## 2023-03-27 LAB — LIPID PANEL
Cholesterol: 171 mg/dL (ref 0–200)
HDL: 33.7 mg/dL — ABNORMAL LOW (ref >39.00)
LDL Cholesterol: 120 mg/dL — ABNORMAL HIGH (ref 0–99)
NonHDL: 137.65
Total CHOL/HDL Ratio: 5
Triglycerides: 87 mg/dL (ref 0.0–149.0)
VLDL: 17.4 mg/dL (ref 0.0–40.0)

## 2023-03-27 LAB — HEMOGLOBIN A1C: Hgb A1c MFr Bld: 5.9 % (ref 4.6–6.5)

## 2023-03-27 LAB — TSH: TSH: 0.92 u[IU]/mL (ref 0.35–5.50)

## 2023-03-27 MED ORDER — METRONIDAZOLE 0.75 % VA GEL
VAGINAL | 0 refills | Status: DC
  Filled 2023-03-27 (×2): qty 140, 56d supply, fill #0

## 2023-03-27 NOTE — Patient Instructions (Signed)
Thank you for trusting [place] with your health care.  Please go downstairs for labs before you leave today.  Limit sugar, sweets, junk food and carbohydrates.  Try to eat healthy snacks and get adequate protein in your diet. Stay well-hydrated.  Limit sugary beverages.  Get at least 150 minutes of physical activity each week.  We will be in touch with your lab results.

## 2023-03-27 NOTE — Assessment & Plan Note (Signed)
A1c 5.9% in 2020.  Recheck A1c today.

## 2023-03-27 NOTE — Assessment & Plan Note (Addendum)
Counseling on a low sugar, low carbohydrate diet and increasing protein and fiber.  Discussed staying well-hydrated.  Recommend at least 150 minutes of physical activity each week.  Check labs to look for complications related to obesity.

## 2023-03-27 NOTE — Assessment & Plan Note (Signed)
Preventive health care reviewed.  Her mood is good.  She sees her OB/GYN regularly.  Counseling on healthy lifestyle including diet and exercise.  Up-to-date with dental and eye exams.  Immunizations reviewed.  Discussed safety.

## 2023-03-27 NOTE — Progress Notes (Signed)
New Patient Office Visit  Subjective    Patient ID: Gabriella Phillips, female    DOB: Apr 17, 2001  Age: 22 y.o. 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 UTD Eye exam in the past year    Working at [employer] in [employer] [employer].  Graduated from [employer]   Denies alcohol, smoking, or drug use.      Outpatient Encounter Medications as of 03/27/2023  Medication Sig   metroNIDAZOLE (METROGEL) 0.75 % vaginal gel Insert 1 applicatorful at bedtime Vaginal once a week x 8 weeks (Patient not taking: Reported on 03/27/2023)   No facility-administered encounter medications on file as of 03/27/2023.    Past Medical History:  Diagnosis Date   Macromastia     Past Surgical History:  Procedure Laterality Date   BREAST REDUCTION SURGERY Bilateral 05/12/2019   Procedure: MAMMARY REDUCTION  (BREAST);  Surgeon: Peggye Form, DO;  Location: [facility];  Service: Plastics;  Laterality: Bilateral;  Please adjust timeframe to 4 hours (240 min)   WISDOM TOOTH EXTRACTION      Family History  Problem Relation Age of Onset   Hypertension Mother     Social History   Socioeconomic History   Marital status: Single    Spouse name: Not on file   Number of children: Not on file   Years of education: Not on file   Highest education level: Not on file  Occupational History   Not on file  Tobacco Use   Smoking status: Never   Smokeless tobacco: Never  Vaping Use   Vaping Use: Never used  Substance and Sexual Activity   Alcohol use: Never   Drug use: Never   Sexual activity: Yes    Birth control/protection: Condom, Pill  Other Topics Concern   Not on file  Social History Narrative   Not on file   Social Determinants of Health   Financial Resource Strain: Not on file  Food Insecurity: Not on file   Transportation Needs: Not on file  Physical Activity: Not on file  Stress: Not on file  Social Connections: Not on file  Intimate Partner Violence: Not on file    Review of Systems  Constitutional:  Negative for chills, fever, malaise/fatigue and weight loss.  HENT:  Negative for congestion, ear pain, sinus pain and sore throat.   Eyes:  Negative for blurred vision, double vision and pain.  Respiratory:  Negative for cough, shortness of breath and wheezing.   Cardiovascular:  Negative for chest pain, palpitations and leg swelling.  Gastrointestinal:  Negative for abdominal pain, constipation, diarrhea, nausea and vomiting.  Genitourinary:  Negative for dysuria, frequency and urgency.  Musculoskeletal:  Negative for back pain, joint pain and myalgias.  Skin:  Negative for rash.  Neurological:  Negative for dizziness, tingling, focal weakness and headaches.  Endo/Heme/Allergies:  Does not bruise/bleed easily.  Psychiatric/Behavioral:  Negative for depression. The patient is not nervous/anxious.         Objective    BP 110/66 (BP Location: Left Arm, Patient Position: Sitting, Cuff Size: Large)   Pulse 73   Temp 97.6 F (36.4 C) (Temporal)   Ht  (1.651 m)   Wt 201 lb (91.2 kg)   SpO2 99%   BMI 33.45  kg/m   Physical Exam Constitutional:      General: She is not in acute distress. HENT:     Right Ear: Tympanic membrane, ear canal and external ear normal.     Left Ear: Tympanic membrane, ear canal and external ear normal.     Nose: Nose normal.     Mouth/Throat:     Mouth: Mucous membranes are moist.     Pharynx: Oropharynx is clear.  Eyes:     Extraocular Movements: Extraocular movements intact.     Conjunctiva/sclera: Conjunctivae normal.     Pupils: Pupils are equal, round, and reactive to light.  Neck:     Thyroid: No thyroid mass, thyromegaly or thyroid tenderness.  Cardiovascular:     Rate and Rhythm: Normal rate and regular rhythm.     Pulses: Normal  pulses.     Heart sounds: Normal heart sounds.  Pulmonary:     Effort: Pulmonary effort is normal.     Breath sounds: Normal breath sounds.  Abdominal:     General: Bowel sounds are normal.     Palpations: Abdomen is soft.     Tenderness: There is no abdominal tenderness. There is no right CVA tenderness, left CVA tenderness, guarding or rebound.  Musculoskeletal:        General: Normal range of motion.     Cervical back: Normal range of motion and neck supple. No tenderness.     Right lower leg: No edema.     Left lower leg: No edema.  Lymphadenopathy:     Cervical: No cervical adenopathy.  Skin:    General: Skin is warm and dry.     Findings: No lesion or rash.  Neurological:     General: No focal deficit present.     Mental Status: She is alert and oriented to person, place, and time.     Cranial Nerves: No cranial nerve deficit.     Sensory: No sensory deficit.     Motor: No weakness.     Gait: Gait normal.  Psychiatric:        Mood and Affect: Mood normal.        Behavior: Behavior normal.        Thought Content: Thought content normal.         Assessment & Plan:   Problem List Items Addressed This Visit       Other   Elevated LDL cholesterol level    Recommend low-fat, low-cholesterol diet and getting at least 150 minutes of physical activity each week.      Relevant Orders   Lipid panel   Encounter for general adult medical examination with abnormal findings - Primary    Preventive health care reviewed.  Her mood is good.  She sees her OB/GYN regularly.  Counseling on healthy lifestyle including diet and exercise.  Up-to-date with dental and eye exams.  Immunizations reviewed.  Discussed safety.       Obesity (BMI 30.0-34.9)    Counseling on a low sugar, low carbohydrate diet and increasing protein and fiber.  Discussed staying well-hydrated.  Recommend at least 150 minutes of physical activity each week.  Check labs to look for complications related to  obesity.      Relevant Orders   CBC with Differential/Platelet   Comprehensive metabolic panel   TSH   T4, free   Prediabetes    A1c 5.9% in 2020.  Recheck A1c today.        Relevant Orders   CBC  with Differential/Platelet   Comprehensive metabolic panel   TSH   T4, free   Hemoglobin A1c    Return in about 1 year (around 03/26/2024).   Hetty Blend, NP-C

## 2023-03-27 NOTE — Assessment & Plan Note (Signed)
Recommend low-fat, low-cholesterol diet and getting at least 150 minutes of physical activity each week. 

## 2023-05-18 ENCOUNTER — Telehealth: Payer: 59 | Admitting: Nurse Practitioner

## 2023-05-18 ENCOUNTER — Other Ambulatory Visit (HOSPITAL_COMMUNITY): Payer: Self-pay

## 2023-05-18 DIAGNOSIS — R399 Unspecified symptoms and signs involving the genitourinary system: Secondary | ICD-10-CM | POA: Diagnosis not present

## 2023-05-18 MED ORDER — NITROFURANTOIN MONOHYD MACRO 100 MG PO CAPS
100.0000 mg | ORAL_CAPSULE | Freq: Two times a day (BID) | ORAL | 0 refills | Status: AC
  Filled 2023-05-18: qty 10, 5d supply, fill #0

## 2023-05-18 NOTE — Progress Notes (Signed)
I have spent 5 minutes in review of e-visit questionnaire, review and updating patient chart, medical decision making and response to patient.  ° °Kimberly Nieland W Marykathleen Russi, NP ° °  °

## 2023-05-18 NOTE — Progress Notes (Signed)

## 2023-05-19 ENCOUNTER — Other Ambulatory Visit (HOSPITAL_COMMUNITY): Payer: Self-pay

## 2023-05-20 ENCOUNTER — Other Ambulatory Visit (HOSPITAL_COMMUNITY): Payer: Self-pay

## 2023-05-20 DIAGNOSIS — N898 Other specified noninflammatory disorders of vagina: Secondary | ICD-10-CM | POA: Diagnosis not present

## 2023-05-20 DIAGNOSIS — N766 Ulceration of vulva: Secondary | ICD-10-CM | POA: Diagnosis not present

## 2023-05-20 DIAGNOSIS — R3989 Other symptoms and signs involving the genitourinary system: Secondary | ICD-10-CM | POA: Diagnosis not present

## 2023-05-20 MED ORDER — VALACYCLOVIR HCL 1 G PO TABS
1000.0000 mg | ORAL_TABLET | Freq: Two times a day (BID) | ORAL | 0 refills | Status: DC
  Filled 2023-05-20: qty 20, 10d supply, fill #0

## 2023-05-23 ENCOUNTER — Other Ambulatory Visit (HOSPITAL_COMMUNITY): Payer: Self-pay

## 2023-08-01 DIAGNOSIS — Z3400 Encounter for supervision of normal first pregnancy, unspecified trimester: Secondary | ICD-10-CM | POA: Diagnosis not present

## 2023-08-01 LAB — OB RESULTS CONSOLE RUBELLA ANTIBODY, IGM: Rubella: IMMUNE

## 2023-08-01 LAB — HEPATITIS C ANTIBODY: HCV Ab: NEGATIVE

## 2023-08-01 LAB — OB RESULTS CONSOLE HEPATITIS B SURFACE ANTIGEN: Hepatitis B Surface Ag: NEGATIVE

## 2023-08-01 LAB — OB RESULTS CONSOLE HIV ANTIBODY (ROUTINE TESTING): HIV: NONREACTIVE

## 2023-08-06 ENCOUNTER — Other Ambulatory Visit (HOSPITAL_COMMUNITY)
Admission: RE | Admit: 2023-08-06 | Discharge: 2023-08-06 | Disposition: A | Discharge status: Home / Self Care | Payer: 59 | Source: Ambulatory Visit | Attending: Obstetrics and Gynecology | Admitting: Obstetrics and Gynecology

## 2023-08-06 ENCOUNTER — Other Ambulatory Visit: Payer: Self-pay | Admitting: Obstetrics and Gynecology

## 2023-08-06 DIAGNOSIS — Z3490 Encounter for supervision of normal pregnancy, unspecified, unspecified trimester: Secondary | ICD-10-CM | POA: Insufficient documentation

## 2023-08-06 DIAGNOSIS — Z3687 Encounter for antenatal screening for uncertain dates: Secondary | ICD-10-CM | POA: Diagnosis not present

## 2023-08-06 DIAGNOSIS — Z3400 Encounter for supervision of normal first pregnancy, unspecified trimester: Secondary | ICD-10-CM | POA: Diagnosis not present

## 2023-08-06 LAB — OB RESULTS CONSOLE GC/CHLAMYDIA
Chlamydia: NEGATIVE
Neisseria Gonorrhea: NEGATIVE

## 2023-08-11 LAB — CYTOLOGY - PAP: Diagnosis: NEGATIVE

## 2023-09-11 DIAGNOSIS — Z3402 Encounter for supervision of normal first pregnancy, second trimester: Secondary | ICD-10-CM | POA: Diagnosis not present

## 2023-09-11 DIAGNOSIS — Z23 Encounter for immunization: Secondary | ICD-10-CM | POA: Diagnosis not present

## 2023-10-20 DIAGNOSIS — Z362 Encounter for other antenatal screening follow-up: Secondary | ICD-10-CM | POA: Diagnosis not present

## 2023-12-02 DIAGNOSIS — Z3490 Encounter for supervision of normal pregnancy, unspecified, unspecified trimester: Secondary | ICD-10-CM | POA: Diagnosis not present

## 2023-12-02 DIAGNOSIS — Z3402 Encounter for supervision of normal first pregnancy, second trimester: Secondary | ICD-10-CM | POA: Diagnosis not present

## 2023-12-02 LAB — OB RESULTS CONSOLE RPR: RPR: NONREACTIVE

## 2024-01-09 DIAGNOSIS — Z3403 Encounter for supervision of normal first pregnancy, third trimester: Secondary | ICD-10-CM | POA: Diagnosis not present

## 2024-01-09 DIAGNOSIS — Z23 Encounter for immunization: Secondary | ICD-10-CM | POA: Diagnosis not present

## 2024-01-28 ENCOUNTER — Other Ambulatory Visit (HOSPITAL_COMMUNITY): Payer: Self-pay

## 2024-01-28 ENCOUNTER — Other Ambulatory Visit: Payer: Self-pay

## 2024-01-28 MED ORDER — VALACYCLOVIR HCL 1 G PO TABS
1000.0000 mg | ORAL_TABLET | Freq: Every day | ORAL | 1 refills | Status: DC
  Filled 2024-01-28: qty 30, 30d supply, fill #0

## 2024-02-05 DIAGNOSIS — Z3490 Encounter for supervision of normal pregnancy, unspecified, unspecified trimester: Secondary | ICD-10-CM | POA: Diagnosis not present

## 2024-02-05 DIAGNOSIS — Z3403 Encounter for supervision of normal first pregnancy, third trimester: Secondary | ICD-10-CM | POA: Diagnosis not present

## 2024-02-05 LAB — OB RESULTS CONSOLE GBS: GBS: NEGATIVE

## 2024-02-13 DIAGNOSIS — O368190 Decreased fetal movements, unspecified trimester, not applicable or unspecified: Secondary | ICD-10-CM | POA: Diagnosis not present

## 2024-02-13 DIAGNOSIS — O26843 Uterine size-date discrepancy, third trimester: Secondary | ICD-10-CM | POA: Diagnosis not present

## 2024-02-16 ENCOUNTER — Inpatient Hospital Stay (HOSPITAL_COMMUNITY)
Admission: AD | Admit: 2024-02-16 | Discharge: 2024-02-16 | Disposition: A | Discharge status: Home / Self Care | Attending: Obstetrics and Gynecology | Admitting: Obstetrics and Gynecology

## 2024-02-16 ENCOUNTER — Encounter (HOSPITAL_COMMUNITY): Payer: Self-pay

## 2024-02-16 ENCOUNTER — Other Ambulatory Visit (HOSPITAL_COMMUNITY): Payer: Self-pay

## 2024-02-16 DIAGNOSIS — O219 Vomiting of pregnancy, unspecified: Secondary | ICD-10-CM | POA: Diagnosis not present

## 2024-02-16 DIAGNOSIS — O2343 Unspecified infection of urinary tract in pregnancy, third trimester: Secondary | ICD-10-CM | POA: Insufficient documentation

## 2024-02-16 DIAGNOSIS — R030 Elevated blood-pressure reading, without diagnosis of hypertension: Secondary | ICD-10-CM | POA: Insufficient documentation

## 2024-02-16 DIAGNOSIS — O26893 Other specified pregnancy related conditions, third trimester: Secondary | ICD-10-CM | POA: Diagnosis not present

## 2024-02-16 DIAGNOSIS — N390 Urinary tract infection, site not specified: Secondary | ICD-10-CM | POA: Insufficient documentation

## 2024-02-16 DIAGNOSIS — Z3A37 37 weeks gestation of pregnancy: Secondary | ICD-10-CM | POA: Diagnosis not present

## 2024-02-16 DIAGNOSIS — R1110 Vomiting, unspecified: Secondary | ICD-10-CM | POA: Diagnosis present

## 2024-02-16 HISTORY — DX: Other specified health status: Z78.9

## 2024-02-16 LAB — PROTEIN / CREATININE RATIO, URINE
Creatinine, Urine: 290 mg/dL
Protein Creatinine Ratio: 0.2 mg/mg{creat} — ABNORMAL HIGH (ref 0.00–0.15)
Total Protein, Urine: 58 mg/dL

## 2024-02-16 LAB — CBC
HCT: 32 % — ABNORMAL LOW (ref 36.0–46.0)
Hemoglobin: 10.6 g/dL — ABNORMAL LOW (ref 12.0–15.0)
MCH: 29.7 pg (ref 26.0–34.0)
MCHC: 33.1 g/dL (ref 30.0–36.0)
MCV: 89.6 fL (ref 80.0–100.0)
Platelets: 222 10*3/uL (ref 150–400)
RBC: 3.57 MIL/uL — ABNORMAL LOW (ref 3.87–5.11)
RDW: 13.7 % (ref 11.5–15.5)
WBC: 9.6 10*3/uL (ref 4.0–10.5)
nRBC: 0 % (ref 0.0–0.2)

## 2024-02-16 LAB — COMPREHENSIVE METABOLIC PANEL
ALT: 15 U/L (ref 0–44)
AST: 22 U/L (ref 15–41)
Albumin: 2.9 g/dL — ABNORMAL LOW (ref 3.5–5.0)
Alkaline Phosphatase: 112 U/L (ref 38–126)
Anion gap: 12 (ref 5–15)
BUN: 6 mg/dL (ref 6–20)
CO2: 21 mmol/L — ABNORMAL LOW (ref 22–32)
Calcium: 9 mg/dL (ref 8.9–10.3)
Chloride: 104 mmol/L (ref 98–111)
Creatinine, Ser: 0.66 mg/dL (ref 0.44–1.00)
GFR, Estimated: >60 mL/min (ref >60)
Glucose, Bld: 98 mg/dL (ref 70–99)
Potassium: 3 mmol/L — ABNORMAL LOW (ref 3.5–5.1)
Sodium: 137 mmol/L (ref 135–145)
Total Bilirubin: 0.8 mg/dL (ref 0.0–1.2)
Total Protein: 6.5 g/dL (ref 6.5–8.1)

## 2024-02-16 LAB — URINALYSIS, ROUTINE W REFLEX MICROSCOPIC
Bilirubin Urine: NEGATIVE
Glucose, UA: NEGATIVE mg/dL
Hgb urine dipstick: NEGATIVE
Ketones, ur: 80 mg/dL — AB
Nitrite: NEGATIVE
Protein, ur: 100 mg/dL — AB
Specific Gravity, Urine: 1.02 (ref 1.005–1.030)
WBC, UA: >50 WBC/hpf (ref 0–5)
pH: 5 (ref 5.0–8.0)

## 2024-02-16 MED ORDER — POTASSIUM CHLORIDE CRYS ER 20 MEQ PO TBCR
20.0000 meq | EXTENDED_RELEASE_TABLET | Freq: Once | ORAL | Status: AC
  Administered 2024-02-16: 20 meq via ORAL
  Filled 2024-02-16: qty 1

## 2024-02-16 MED ORDER — POTASSIUM CHLORIDE CRYS ER 20 MEQ PO TBCR
20.0000 meq | EXTENDED_RELEASE_TABLET | Freq: Every day | ORAL | 0 refills | Status: DC
  Filled 2024-02-16: qty 5, 5d supply, fill #0

## 2024-02-16 MED ORDER — LACTATED RINGERS IV BOLUS
1000.0000 mL | Freq: Once | INTRAVENOUS | Status: AC
  Administered 2024-02-16: 1000 mL via INTRAVENOUS

## 2024-02-16 MED ORDER — SODIUM CHLORIDE 0.9 % IV BOLUS
1000.0000 mL | Freq: Once | INTRAVENOUS | Status: AC
  Administered 2024-02-16: 1000 mL via INTRAVENOUS

## 2024-02-16 MED ORDER — SODIUM CHLORIDE 0.9 % IV SOLN
2.0000 g | Freq: Once | INTRAVENOUS | Status: AC
  Administered 2024-02-16: 2 g via INTRAVENOUS
  Filled 2024-02-16: qty 20

## 2024-02-16 MED ORDER — ONDANSETRON 4 MG PO TBDP
4.0000 mg | ORAL_TABLET | Freq: Three times a day (TID) | ORAL | 0 refills | Status: DC | PRN
  Filled 2024-02-16: qty 30, 10d supply, fill #0

## 2024-02-16 MED ORDER — ONDANSETRON HCL 4 MG/2ML IJ SOLN
4.0000 mg | Freq: Once | INTRAMUSCULAR | Status: AC
  Administered 2024-02-16: 4 mg via INTRAVENOUS
  Filled 2024-02-16: qty 2

## 2024-02-16 MED ORDER — CEFADROXIL 500 MG PO CAPS
500.0000 mg | ORAL_CAPSULE | Freq: Two times a day (BID) | ORAL | 0 refills | Status: DC
  Filled 2024-02-16: qty 14, 7d supply, fill #0

## 2024-02-16 NOTE — MAU Provider Note (Signed)
 MAU Provider Note  Chief Complaint: Emesis   Event Date/Time   First Provider Initiated Contact with Patient 02/16/24 0211      SUBJECTIVE HPI: Gabriella Phillips is a 23 y.o. G1P0 at [redacted]w[redacted]d by LMP who presents to maternity admissions reporting nausea and vomiting. Pregnancy c/b none. Receives [facility] with [facility].  Patient states that she has been vomiting consistently since 10:30 PM.  Denies sick contacts, fever/chills, diarrhea, urinary symptoms, contractions, loss of fluid, vaginal bleeding.  Denies headache, vision changes, right upper quadrant pain, extremity swelling.  She has not tried any medications at home.  She states fetal movement is active.  I think she must have ate something expired.  She denies any problems with pregnancy so far and denies any problems with high blood pressure.  HPI  Past Medical History:  Diagnosis Date   Macromastia    Medical history non-contributory    Past Surgical History:  Procedure Laterality Date   BREAST REDUCTION SURGERY Bilateral 05/12/2019   Procedure: MAMMARY REDUCTION  (BREAST);  Surgeon: Peggye Form, DO;  Location: [facility];  Service: Plastics;  Laterality: Bilateral;  Please adjust timeframe to 4 hours (240 min)   WISDOM TOOTH EXTRACTION     Social History   Socioeconomic History   Marital status: Single    Spouse name: Not on file   Number of children: Not on file   Years of education: Not on file   Highest education level: Not on file  Occupational History   Not on file  Tobacco Use   Smoking status: Never   Smokeless tobacco: Never  Vaping Use   Vaping status: Never Used  Substance and Sexual Activity   Alcohol use: Never   Drug use: Never   Sexual activity: Yes    Birth control/protection: Condom, Pill  Other Topics Concern   Not on file  Social History Narrative   Not on file   Social Drivers of Health   Financial Resource Strain: Not on file  Food Insecurity: Not on file   Transportation Needs: Not on file  Physical Activity: Not on file  Stress: Not on file  Social Connections: Not on file  Intimate Partner Violence: Not on file   No current facility-administered medications on file prior to encounter.   Current Outpatient Medications on File Prior to Encounter  Medication Sig Dispense Refill   Prenatal Vit-Fe Fumarate-FA (MULTIVITAMIN-PRENATAL) 27-0.8 MG TABS tablet Take 1 tablet by mouth daily at 12 noon.     valACYclovir (VALTREX) 1000 MG tablet Take 1 tablet (1,000 mg total) by mouth 2 (two) times daily for 10 days. 20 tablet 0   metroNIDAZOLE (METROGEL) 0.75 % vaginal gel Insert 1 applicatorful at bedtime Vaginal once a week x 8 weeks (Patient not taking: Reported on 03/27/2023) 140 g 0   valACYclovir (VALTREX) 1000 MG tablet Take 1 tablet (1,000 mg total) by mouth daily. 30 tablet 1   No Known Allergies  ROS:  Pertinent positives/negatives listed above.  I have reviewed patient's Past Medical Hx, Surgical Hx, Family Hx, Social Hx, medications and allergies.   Physical Exam  Patient Vitals for the past 24 hrs:  BP Temp Temp src Pulse Resp SpO2 Height Weight  02/16/24 0400 -- -- -- -- -- 98 % -- --  02/16/24 0355 -- -- -- -- -- 98 % -- --  02/16/24 0345 -- -- -- -- -- 99 % -- --  02/16/24 0340 -- -- -- -- -- 99 % -- --  02/16/24 0335 -- -- -- -- -- 99 % -- --  02/16/24 0330 130/81 -- -- (!) 109 -- 99 % -- --  02/16/24 0325 -- -- -- -- -- 100 % -- --  02/16/24 0320 -- -- -- -- -- 98 % -- --  02/16/24 0315 131/86 -- -- (!) 111 -- 99 % -- --  02/16/24 0310 -- -- -- -- -- 99 % -- --  02/16/24 0305 -- -- -- -- -- 99 % -- --  02/16/24 0300 131/85 -- -- (!) 108 -- 99 % -- --  02/16/24 0255 -- -- -- -- -- 99 % -- --  02/16/24 0251 -- -- -- -- -- 99 % -- --  02/16/24 0246 (!) 134/90 -- -- (!) 115 -- 99 % -- --  02/16/24 0241 -- -- -- -- -- 100 % -- --  02/16/24 0236 -- -- -- -- -- 99 % -- --  02/16/24 0231 134/84 -- -- (!) 115 -- 99 % -- --   02/16/24 0226 -- -- -- -- -- 99 % -- --  02/16/24 0221 -- -- -- -- -- 100 % -- --  02/16/24 0216 -- -- -- -- -- 100 % -- --  02/16/24 0211 -- -- -- -- -- 99 % -- --  02/16/24 0209 (!) 142/86 -- -- (!) 108 -- -- -- --  02/16/24 0206 -- -- -- -- -- 100 % -- --  02/16/24 0203 132/87 99.6 F (37.6 C) Oral (!) 124 19 99 % -- --  02/16/24 0155 -- -- -- -- -- -- 5\' 5"  (1.651 m) 107.8 kg   Constitutional: Well-developed, well-nourished female in no acute distress  Cardiovascular: Mild tachycardia Respiratory: normal effort GI: Abd soft, non-tender MS: Extremities nontender, no edema, normal ROM Neurologic: Alert and oriented x 4   FHT:  Baseline 170, moderate variability, accelerations present, no decelerations Contractions: q 3 mins, not feeling contractions  LAB RESULTS Results for orders placed or performed during the hospital encounter of 02/16/24 (from the past 24 hours)  Urinalysis, Routine w reflex microscopic -Urine, Clean Catch     Status: Abnormal   Collection Time: 02/16/24  2:11 AM  Result Value Ref Range   Color, Urine AMBER (A) YELLOW   APPearance CLOUDY (A) CLEAR   Specific Gravity, Urine 1.020 1.005 - 1.030   pH 5.0 5.0 - 8.0   Glucose, UA NEGATIVE NEGATIVE mg/dL   Hgb urine dipstick NEGATIVE NEGATIVE   Bilirubin Urine NEGATIVE NEGATIVE   Ketones, ur 80 (A) NEGATIVE mg/dL   Protein, ur 956 (A) NEGATIVE mg/dL   Nitrite NEGATIVE NEGATIVE   Leukocytes,Ua LARGE (A) NEGATIVE   RBC / HPF 11-20 0 - 5 RBC/hpf   WBC, UA >50 0 - 5 WBC/hpf   Bacteria, UA MANY (A) NONE SEEN   Squamous Epithelial / HPF 21-50 0 - 5 /HPF   Mucus PRESENT   Protein / creatinine ratio, urine     Status: Abnormal   Collection Time: 02/16/24  2:11 AM  Result Value Ref Range   Creatinine, Urine 290 mg/dL   Total Protein, Urine 58 mg/dL   Protein Creatinine Ratio 0.20 (H) 0.00 - 0.15 mg/mg[Cre]  CBC     Status: Abnormal   Collection Time: 02/16/24  2:59 AM  Result Value Ref Range   WBC 9.6 4.0 -  10.5 K/uL   RBC 3.57 (L) 3.87 - 5.11 MIL/uL   Hemoglobin 10.6 (L) 12.0 - 15.0 g/dL   HCT 21.3 (L)  36.0 - 46.0 %   MCV 89.6 80.0 - 100.0 fL   MCH 29.7 26.0 - 34.0 pg   MCHC 33.1 30.0 - 36.0 g/dL   RDW 09.8 11.9 - 14.7 %   Platelets 222 150 - 400 K/uL   nRBC 0.0 0.0 - 0.2 %  Comprehensive metabolic panel     Status: Abnormal   Collection Time: 02/16/24  2:59 AM  Result Value Ref Range   Sodium 137 135 - 145 mmol/L   Potassium 3.0 (L) 3.5 - 5.1 mmol/L   Chloride 104 98 - 111 mmol/L   CO2 21 (L) 22 - 32 mmol/L   Glucose, Bld 98 70 - 99 mg/dL   BUN 6 6 - 20 mg/dL   Creatinine, Ser 8.29 0.44 - 1.00 mg/dL   Calcium 9.0 8.9 - 56.2 mg/dL   Total Protein 6.5 6.5 - 8.1 g/dL   Albumin 2.9 (L) 3.5 - 5.0 g/dL   AST 22 15 - 41 U/L   ALT 15 0 - 44 U/L   Alkaline Phosphatase 112 38 - 126 U/L   Total Bilirubin 0.8 0.0 - 1.2 mg/dL   GFR, Estimated >13 >08 mL/min   Anion gap 12 5 - 15       IMAGING No results found.  MAU Management/MDM: Orders Placed This Encounter  Procedures   Culture, OB Urine   Urinalysis, Routine w reflex microscopic -Urine, Clean Catch   CBC   Comprehensive metabolic panel   Protein / creatinine ratio, urine    Meds ordered this encounter  Medications   lactated ringers bolus 1,000 mL   ondansetron (ZOFRAN) injection 4 mg   cefTRIAXone (ROCEPHIN) 2 g in sodium chloride 0.9 % 100 mL IVPB    Antibiotic Indication::   UTI   sodium chloride 0.9 % bolus 1,000 mL   potassium chloride SA (KLOR-CON M) CR tablet 20 mEq   potassium chloride SA (KLOR-CON M) 20 MEQ tablet    Sig: Take 1 tablet (20 mEq total) by mouth daily.    Dispense:  5 tablet    Refill:  0   cefadroxil (DURICEF) 500 MG capsule    Sig: Take 1 capsule (500 mg total) by mouth 2 (two) times daily.    Dispense:  14 capsule    Refill:  0   ondansetron (ZOFRAN-ODT) 4 MG disintegrating tablet    Sig: Take 1 tablet (4 mg total) by mouth every 8 (eight) hours as needed.    Dispense:  30 tablet     Refill:  0     Available prenatal records reviewed.  Patient presents with 4 hours of nausea/vomiting at term.  She states that she thinks she ate something that is making her vomit.  Will treat symptoms with Zofran.  Baby is tachycardic, although with moderate variability and accelerations.  Will keep on continuous monitoring, and give 1 L LR bolus.  Initial blood pressure very mildly elevated, will obtain pre-E labs.  0252: UA result came back are consistent with UTI.  Urine culture added. Does not meet criteria for pyelonephritis/sepsis at this time (no fever, flank pain), although does have N/V, tachycardia.  Will start treatment with ceftriaxone 2 g IV here.   6578: Patient reassessed at the bedside.  Notes feeling mildly nauseous but has been able to keep down liquids and snack.  Discussed that she has a UTI but was showing some symptoms that suggested she may be developing Pilo.  Discussed that if she gets a fever/chills, cannot  keep down her oral antibiotics, develops flank pain, she should return to the MAU immediately for reevaluation.  Will send out with Duricef x 7 days for treatment.  Additionally will send potassium supplement and Zofran.  Blood pressures returned to normotensive while in MAU.  Did send message to primary OB, Dr. Richardson Dopp, to inform her of borderline pressures.  Patient states that she has a follow-up appointment this week.  FWB: Baby initially tachycardic upon presentation.  After antibiotics and fluids, returned to normal baseline of 155 with moderate variability, accelerations, no decelerations.  Positive fetal movement.  ASSESSMENT 1. Urinary tract infection in mother during third trimester of pregnancy   2. Transient elevated blood pressure   3. Vomiting pregnancy   4. [redacted] weeks gestation of pregnancy     PLAN Discharge home with strict return precautions. Allergies as of 02/16/2024   No Known Allergies      Medication List     STOP taking these medications     metroNIDAZOLE 0.75 % vaginal gel Commonly known as: METROGEL       TAKE these medications    cefadroxil 500 MG capsule Commonly known as: DURICEF Take 1 capsule (500 mg total) by mouth 2 (two) times daily.   multivitamin-prenatal 27-0.8 MG Tabs tablet Take 1 tablet by mouth daily at 12 noon.   ondansetron 4 MG disintegrating tablet Commonly known as: ZOFRAN-ODT Take 1 tablet (4 mg total) by mouth every 8 (eight) hours as needed.   potassium chloride SA 20 MEQ tablet Commonly known as: KLOR-CON M Take 1 tablet (20 mEq total) by mouth daily.   valACYclovir 1000 MG tablet Commonly known as: VALTREX Take 1 tablet (1,000 mg total) by mouth 2 (two) times daily for 10 days.   valACYclovir 1000 MG tablet Commonly known as: VALTREX Take 1 tablet (1,000 mg total) by mouth daily.         Wylene Simmer, MD OB Fellow 02/16/2024  4:37 AM

## 2024-02-16 NOTE — MAU Note (Signed)
.  Gabriella Phillips is a 24 y.o. at [redacted]w[redacted]d here in MAU reporting:   Emesis since 1030pm, has not been able to keep anything down. Pt states she has been vomiting for two hours now.  Has not taken any medication.  No LOF, NO bleeding, +FM.   Pain score: stomach ache from vomiting.8/10  FHT: 170  Lab orders placed from triage: UA  Vitals:   02/16/24 0203  BP: 132/87  Pulse: (!) 124  Resp: 19  Temp: 99.6 F (37.6 C)  SpO2: 99%

## 2024-02-17 LAB — CULTURE, OB URINE

## 2024-02-19 DIAGNOSIS — O99213 Obesity complicating pregnancy, third trimester: Secondary | ICD-10-CM | POA: Diagnosis not present

## 2024-02-19 DIAGNOSIS — O26843 Uterine size-date discrepancy, third trimester: Secondary | ICD-10-CM | POA: Diagnosis not present

## 2024-02-27 ENCOUNTER — Other Ambulatory Visit: Payer: Self-pay | Admitting: Obstetrics and Gynecology

## 2024-03-03 DIAGNOSIS — R030 Elevated blood-pressure reading, without diagnosis of hypertension: Secondary | ICD-10-CM | POA: Diagnosis not present

## 2024-03-03 DIAGNOSIS — Z3403 Encounter for supervision of normal first pregnancy, third trimester: Secondary | ICD-10-CM | POA: Diagnosis not present

## 2024-03-04 ENCOUNTER — Other Ambulatory Visit: Payer: Self-pay | Admitting: Obstetrics and Gynecology

## 2024-03-04 ENCOUNTER — Other Ambulatory Visit: Payer: Self-pay

## 2024-03-04 ENCOUNTER — Encounter (HOSPITAL_COMMUNITY): Payer: Self-pay | Admitting: Obstetrics and Gynecology

## 2024-03-04 ENCOUNTER — Inpatient Hospital Stay (HOSPITAL_COMMUNITY)
Admission: RE | Admit: 2024-03-04 | Discharge: 2024-03-07 | DRG: 787 | Disposition: A | Discharge status: Home / Self Care | Attending: Obstetrics and Gynecology | Admitting: Obstetrics and Gynecology

## 2024-03-04 DIAGNOSIS — O9832 Other infections with a predominantly sexual mode of transmission complicating childbirth: Secondary | ICD-10-CM | POA: Diagnosis not present

## 2024-03-04 DIAGNOSIS — O133 Gestational [pregnancy-induced] hypertension without significant proteinuria, third trimester: Principal | ICD-10-CM

## 2024-03-04 DIAGNOSIS — Z8249 Family history of ischemic heart disease and other diseases of the circulatory system: Secondary | ICD-10-CM | POA: Diagnosis not present

## 2024-03-04 DIAGNOSIS — O134 Gestational [pregnancy-induced] hypertension without significant proteinuria, complicating childbirth: Principal | ICD-10-CM | POA: Diagnosis present

## 2024-03-04 DIAGNOSIS — A6000 Herpesviral infection of urogenital system, unspecified: Secondary | ICD-10-CM | POA: Diagnosis not present

## 2024-03-04 DIAGNOSIS — Z3A40 40 weeks gestation of pregnancy: Secondary | ICD-10-CM | POA: Diagnosis not present

## 2024-03-04 DIAGNOSIS — O139 Gestational [pregnancy-induced] hypertension without significant proteinuria, unspecified trimester: Principal | ICD-10-CM | POA: Diagnosis present

## 2024-03-04 DIAGNOSIS — E66813 Obesity, class 3: Secondary | ICD-10-CM | POA: Diagnosis not present

## 2024-03-04 DIAGNOSIS — O99214 Obesity complicating childbirth: Secondary | ICD-10-CM | POA: Diagnosis present

## 2024-03-04 LAB — CBC
HCT: 32.3 % — ABNORMAL LOW (ref 36.0–46.0)
Hemoglobin: 10.8 g/dL — ABNORMAL LOW (ref 12.0–15.0)
MCH: 29.4 pg (ref 26.0–34.0)
MCHC: 33.4 g/dL (ref 30.0–36.0)
MCV: 88 fL (ref 80.0–100.0)
Platelets: 262 10*3/uL (ref 150–400)
RBC: 3.67 MIL/uL — ABNORMAL LOW (ref 3.87–5.11)
RDW: 14.2 % (ref 11.5–15.5)
WBC: 10.3 10*3/uL (ref 4.0–10.5)
nRBC: 0 % (ref 0.0–0.2)

## 2024-03-04 LAB — COMPREHENSIVE METABOLIC PANEL WITH GFR
ALT: 13 U/L (ref 0–44)
AST: 17 U/L (ref 15–41)
Albumin: 2.8 g/dL — ABNORMAL LOW (ref 3.5–5.0)
Alkaline Phosphatase: 143 U/L — ABNORMAL HIGH (ref 38–126)
Anion gap: 9 (ref 5–15)
BUN: <5 mg/dL — ABNORMAL LOW (ref 6–20)
CO2: 21 mmol/L — ABNORMAL LOW (ref 22–32)
Calcium: 9.1 mg/dL (ref 8.9–10.3)
Chloride: 105 mmol/L (ref 98–111)
Creatinine, Ser: 0.63 mg/dL (ref 0.44–1.00)
GFR, Estimated: >60 mL/min (ref >60)
Glucose, Bld: 80 mg/dL (ref 70–99)
Potassium: 3.3 mmol/L — ABNORMAL LOW (ref 3.5–5.1)
Sodium: 135 mmol/L (ref 135–145)
Total Bilirubin: 0.5 mg/dL (ref 0.0–1.2)
Total Protein: 6.7 g/dL (ref 6.5–8.1)

## 2024-03-04 LAB — PROTEIN / CREATININE RATIO, URINE
Creatinine, Urine: 131 mg/dL
Protein Creatinine Ratio: 0.16 mg/mg{creat} — ABNORMAL HIGH (ref 0.00–0.15)
Total Protein, Urine: 21 mg/dL

## 2024-03-04 LAB — TYPE AND SCREEN
ABO/RH(D): A POS
Antibody Screen: NEGATIVE

## 2024-03-04 MED ORDER — OXYTOCIN-SODIUM CHLORIDE 30-0.9 UT/500ML-% IV SOLN
1.0000 m[IU]/min | INTRAVENOUS | Status: DC
  Administered 2024-03-04: 1 m[IU]/min via INTRAVENOUS
  Filled 2024-03-04: qty 500

## 2024-03-04 MED ORDER — SOD CITRATE-CITRIC ACID 500-334 MG/5ML PO SOLN
30.0000 mL | ORAL | Status: DC | PRN
  Administered 2024-03-05: 30 mL via ORAL
  Filled 2024-03-04: qty 30

## 2024-03-04 MED ORDER — HYDROXYZINE HCL 50 MG PO TABS
50.0000 mg | ORAL_TABLET | Freq: Four times a day (QID) | ORAL | Status: DC | PRN
Start: 2024-03-04 — End: 2024-03-05

## 2024-03-04 MED ORDER — OXYCODONE-ACETAMINOPHEN 5-325 MG PO TABS: 1.0000 | ORAL_TABLET | ORAL | Status: DC | PRN

## 2024-03-04 MED ORDER — TERBUTALINE SULFATE 1 MG/ML IJ SOLN: 0.2500 mg | Freq: Once | INTRAMUSCULAR | Status: DC | PRN

## 2024-03-04 MED ORDER — TERBUTALINE SULFATE 1 MG/ML IJ SOLN
0.2500 mg | Freq: Once | INTRAMUSCULAR | Status: AC | PRN
  Administered 2024-03-04: 0.25 mg via SUBCUTANEOUS
  Filled 2024-03-04: qty 1

## 2024-03-04 MED ORDER — MISOPROSTOL 50MCG HALF TABLET
50.0000 ug | ORAL_TABLET | Freq: Once | ORAL | Status: AC
  Administered 2024-03-04: 50 ug via ORAL
  Filled 2024-03-04: qty 1

## 2024-03-04 MED ORDER — ONDANSETRON HCL 4 MG/2ML IJ SOLN: 4.0000 mg | Freq: Four times a day (QID) | INTRAMUSCULAR | Status: DC | PRN

## 2024-03-04 MED ORDER — FENTANYL CITRATE (PF) 100 MCG/2ML IJ SOLN
50.0000 ug | INTRAMUSCULAR | Status: DC | PRN
  Administered 2024-03-04: 50 ug via INTRAVENOUS
  Administered 2024-03-05: 100 ug via INTRAVENOUS
  Filled 2024-03-04 (×3): qty 2

## 2024-03-04 MED ORDER — OXYTOCIN BOLUS FROM INFUSION
333.0000 mL | Freq: Once | INTRAVENOUS | Status: DC
Start: 2024-03-04 — End: 2024-03-05

## 2024-03-04 MED ORDER — OXYCODONE-ACETAMINOPHEN 5-325 MG PO TABS: 2.0000 | ORAL_TABLET | ORAL | Status: DC | PRN

## 2024-03-04 MED ORDER — LACTATED RINGERS IV SOLN
500.0000 mL | INTRAVENOUS | Status: DC | PRN
  Administered 2024-03-04 – 2024-03-05 (×5): 500 mL via INTRAVENOUS

## 2024-03-04 MED ORDER — OXYTOCIN-SODIUM CHLORIDE 30-0.9 UT/500ML-% IV SOLN
2.5000 [IU]/h | INTRAVENOUS | Status: DC
Start: 2024-03-04 — End: 2024-03-05

## 2024-03-04 MED ORDER — POTASSIUM CHLORIDE CRYS ER 20 MEQ PO TBCR
20.0000 meq | EXTENDED_RELEASE_TABLET | Freq: Every day | ORAL | Status: DC
Start: 2024-03-04 — End: 2024-03-07
  Administered 2024-03-04 – 2024-03-05 (×2): 20 meq via ORAL
  Filled 2024-03-04 (×2): qty 1

## 2024-03-04 MED ORDER — LACTATED RINGERS IV SOLN: INTRAVENOUS | Status: DC

## 2024-03-04 MED ORDER — LIDOCAINE HCL (PF) 1 % IJ SOLN: 30.0000 mL | INTRAMUSCULAR | Status: DC | PRN

## 2024-03-04 MED ORDER — MISOPROSTOL 25 MCG QUARTER TABLET
25.0000 ug | ORAL_TABLET | Freq: Once | ORAL | Status: AC
  Administered 2024-03-04: 25 ug via VAGINAL
  Filled 2024-03-04: qty 1

## 2024-03-04 MED ORDER — ACETAMINOPHEN 325 MG PO TABS: 650.0000 mg | ORAL_TABLET | ORAL | Status: DC | PRN

## 2024-03-04 NOTE — Plan of Care (Signed)
 Problem: Education: Goal: Knowledge of General Education information will improve Description: Including pain rating scale, medication(s)/side effects and non-pharmacologic comfort measures 03/04/2024 1935 by Bartholomew Boards, RN Outcome: Progressing 03/04/2024 1935 by Bartholomew Boards, RN Outcome: Progressing   Problem: Health Behavior/Discharge Planning: Goal: Ability to manage health-related needs will improve 03/04/2024 1935 by Bartholomew Boards, RN Outcome: Progressing 03/04/2024 1935 by Bartholomew Boards, RN Outcome: Progressing   Problem: Clinical Measurements: Goal: Ability to maintain clinical measurements within normal limits will improve 03/04/2024 1935 by Bartholomew Boards, RN Outcome: Progressing 03/04/2024 1935 by Bartholomew Boards, RN Outcome: Progressing Goal: Will remain free from infection 03/04/2024 1935 by Bartholomew Boards, RN Outcome: Progressing 03/04/2024 1935 by Bartholomew Boards, RN Outcome: Progressing Goal: Diagnostic test results will improve 03/04/2024 1935 by Bartholomew Boards, RN Outcome: Progressing 03/04/2024 1935 by Bartholomew Boards, RN Outcome: Progressing Goal: Respiratory complications will improve 03/04/2024 1935 by Bartholomew Boards, RN Outcome: Progressing 03/04/2024 1935 by Bartholomew Boards, RN Outcome: Progressing Goal: Cardiovascular complication will be avoided 03/04/2024 1935 by Bartholomew Boards, RN Outcome: Progressing 03/04/2024 1935 by Bartholomew Boards, RN Outcome: Progressing   Problem: Activity: Goal: Risk for activity intolerance will decrease 03/04/2024 1935 by Bartholomew Boards, RN Outcome: Progressing 03/04/2024 1935 by Bartholomew Boards, RN Outcome: Progressing   Problem: Nutrition: Goal: Adequate nutrition will be maintained 03/04/2024 1935 by Bartholomew Boards, RN Outcome: Progressing 03/04/2024 1935 by Bartholomew Boards, RN Outcome: Progressing   Problem: Coping: Goal: Level of anxiety will decrease 03/04/2024 1935 by Bartholomew Boards, RN Outcome:  Progressing 03/04/2024 1935 by Bartholomew Boards, RN Outcome: Progressing   Problem: Elimination: Goal: Will not experience complications related to bowel motility 03/04/2024 1935 by Bartholomew Boards, RN Outcome: Progressing 03/04/2024 1935 by Bartholomew Boards, RN Outcome: Progressing Goal: Will not experience complications related to urinary retention 03/04/2024 1935 by Bartholomew Boards, RN Outcome: Progressing 03/04/2024 1935 by Bartholomew Boards, RN Outcome: Progressing   Problem: Pain Managment: Goal: General experience of comfort will improve and/or be controlled 03/04/2024 1935 by Bartholomew Boards, RN Outcome: Progressing 03/04/2024 1935 by Bartholomew Boards, RN Outcome: Progressing   Problem: Safety: Goal: Ability to remain free from injury will improve 03/04/2024 1935 by Bartholomew Boards, RN Outcome: Progressing 03/04/2024 1935 by Bartholomew Boards, RN Outcome: Progressing   Problem: Skin Integrity: Goal: Risk for impaired skin integrity will decrease 03/04/2024 1935 by Bartholomew Boards, RN Outcome: Progressing 03/04/2024 1935 by Bartholomew Boards, RN Outcome: Progressing   Problem: Education: Goal: Knowledge of Childbirth will improve 03/04/2024 1935 by Bartholomew Boards, RN Outcome: Progressing 03/04/2024 1935 by Bartholomew Boards, RN Outcome: Progressing Goal: Ability to make informed decisions regarding treatment and plan of care will improve 03/04/2024 1935 by Bartholomew Boards, RN Outcome: Progressing 03/04/2024 1935 by Bartholomew Boards, RN Outcome: Progressing Goal: Ability to state and carry out methods to decrease the pain will improve 03/04/2024 1935 by Bartholomew Boards, RN Outcome: Progressing 03/04/2024 1935 by Bartholomew Boards, RN Outcome: Progressing Goal: Individualized Educational Video(s) 03/04/2024 1935 by Bartholomew Boards, RN Outcome: Progressing 03/04/2024 1935 by Bartholomew Boards, RN Outcome: Progressing   Problem: Coping: Goal: Ability to verbalize concerns and feelings  about labor and delivery will improve 03/04/2024 1935 by Bartholomew Boards, RN Outcome: Progressing 03/04/2024 1935 by Bartholomew Boards, RN Outcome: Progressing   Problem: Life Cycle: Goal: Ability to make normal progression  through stages of labor will improve 03/04/2024 1935 by Bartholomew Boards, RN Outcome: Progressing 03/04/2024 1935 by Bartholomew Boards, RN Outcome: Progressing Goal: Ability to effectively push during vaginal delivery will improve 03/04/2024 1935 by Bartholomew Boards, RN Outcome: Progressing 03/04/2024 1935 by Bartholomew Boards, RN Outcome: Progressing   Problem: Role Relationship: Goal: Will demonstrate positive interactions with the child 03/04/2024 1935 by Bartholomew Boards, RN Outcome: Progressing 03/04/2024 1935 by Bartholomew Boards, RN Outcome: Progressing   Problem: Safety: Goal: Risk of complications during labor and delivery will decrease 03/04/2024 1935 by Bartholomew Boards, RN Outcome: Progressing 03/04/2024 1935 by Bartholomew Boards, RN Outcome: Progressing   Problem: Pain Management: Goal: Relief or control of pain from uterine contractions will improve 03/04/2024 1935 by Bartholomew Boards, RN Outcome: Progressing 03/04/2024 1935 by Bartholomew Boards, RN Outcome: Progressing

## 2024-03-04 NOTE — Progress Notes (Signed)
  Subjective: Pt report mild contractions rated as 2-3 out of 10 no lof no vaginal bleeding +FM she denies headache visual disturbances or ruq pain.   Objective: BP 134/86   Pulse 93   Temp 98 F (36.7 C) (Oral)   Resp 16   Ht 5\' 5"  (1.651 m)   Wt 107 kg   SpO2 100%   BMI 39.27 kg/m  No intake/output data recorded. No intake/output data recorded.  FHT:  FHR: 150 bpm, variability: moderate,  accelerations:  Present,  decelerations:  Absent UC:   regular, every 2-4 minutes SVE:   Dilation: 2 Effacement (%): 70 Station: -3 Exam by:: Dr Richardson Dopp  Labs: Lab Results  Component Value Date   WBC 10.3 03/04/2024   HGB 10.8 (L) 03/04/2024   HCT 32.3 (L) 03/04/2024   MCV 88.0 03/04/2024   PLT 262 03/04/2024    Assessment / Plan: Induction of labor due to gestational hypertension   Labor:  start pitocin 1x1 plan AROM with fetal descent  Preeclampsia:   NA Fetal Wellbeing:  Category I and Category II Overall reassuring  Pain Control:  Labor support without medications I/D:  n/a Anticipated MOD:  NSVD  Gerald Leitz, MD 03/04/2024, 8:50 PM

## 2024-03-04 NOTE — H&P (Signed)
 Gabriella Phillips is a 23 y.o. female G1P0 at 40 weeks and 0 days presenting for induction of labor due to gestational hypertension. Pt was seen in the office yesterday for routine ob care with mild range bp 143/ 90 she was asked to come back to the office today for close follow up and bpwas 135-141/ 88-94. She denies headache visual disturbances or RUQ pain. +FM no lof no vaginal bleeding no contractions. 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 She has been on valtrex for suppression. She denies any current outbreak.   Prenatal care provided by Dr. Gerald Leitz with [facility].  02/19/2024 U/S for EFW 7 lbs 10 oz ( 70%ile) vertex Placenta Right lateral    OB History     Gravida  1   Para      Term      Preterm      AB      Living         SAB      IAB      Ectopic      Multiple      Live Births             Past Medical History:  Diagnosis Date   Macromastia    Medical history non-contributory    Past Surgical History:  Procedure Laterality Date   BREAST REDUCTION SURGERY Bilateral 05/12/2019   Procedure: MAMMARY REDUCTION  (BREAST);  Surgeon: Peggye Form, DO;  Location: [facility];  Service: Plastics;  Laterality: Bilateral;  Please adjust timeframe to 4 hours (240 min)   WISDOM TOOTH EXTRACTION     Family History: family history includes Hypertension in her mother. Social History:  reports that she has never smoked. She has never used smokeless tobacco. She reports that she does not drink alcohol and does not use drugs.     Maternal Diabetes: No Genetic Screening: Declined Maternal Ultrasounds/Referrals: Normal Fetal Ultrasounds or other Referrals:  None Maternal Substance Abuse:  No Significant Maternal Medications:  Meds include: Other:  Significant Maternal Lab Results:  Group B Strep negative Number of Prenatal Visits:greater than 3 verified prenatal visits Maternal Vaccinations:TDap Other Comments:  None  Review of Systems   Constitutional: Negative.   HENT: Negative.    Eyes: Negative.   Respiratory: Negative.    Cardiovascular: Negative.   Gastrointestinal: Negative.   Endocrine: Negative.   Genitourinary: Negative.   Musculoskeletal: Negative.   Skin: Negative.   Allergic/Immunologic: Negative.   Neurological: Negative.   Hematological: Negative.   Psychiatric/Behavioral: Negative.     History Dilation: Closed Effacement (%): Thick Station: -2 Exam by:: [employer] RN Blood pressure 138/79, pulse 100, temperature 98 F (36.7 C), temperature source Oral, resp. rate 16, height 5\' 5"  (1.651 m), weight 107 kg, SpO2 100%. Maternal Exam:  Introitus: Normal vulva.   Physical Exam Vitals reviewed.  Constitutional:      Appearance: Normal appearance.  HENT:     Head: Normocephalic.     Nose: Nose normal.     Mouth/Throat:     Mouth: Mucous membranes are moist.  Eyes:     Pupils: Pupils are equal, round, and reactive to light.  Cardiovascular:     Rate and Rhythm: Normal rate and regular rhythm.     Pulses: Normal pulses.     Heart sounds: Normal heart sounds.  Pulmonary:     Effort: Pulmonary effort is normal.     Breath sounds: Normal breath sounds.  Abdominal:  Tenderness: There is no abdominal tenderness.  Genitourinary:    General: Normal vulva.  Musculoskeletal:        General: No swelling.     Cervical back: Normal range of motion.  Skin:    General: Skin is warm and dry.  Neurological:     General: No focal deficit present.     Mental Status: She is alert and oriented to person, place, and time.  Psychiatric:        Mood and Affect: Mood normal.        Behavior: Behavior normal.     Prenatal labs: ABO, Rh: --/--/A POS (04/03 1339) Antibody: NEG (04/03 1339) Rubella: Immune (08/30 0000) RPR: Nonreactive (12/31 0000)  HBsAg: Negative (08/30 0000)  HIV: Non-reactive (08/30 0000)  GBS: Negative/-- (03/06 0000)   Assessment/Plan: 40 weeks and 0 days with  gestational hypertension / HSV 2 infection  - Admit to labor and delivery for induction  - hsv 2 - no current outbreak  - Gestational hypertension - PIH labs to rule out preeclampsia  - Cytotec 50 mcg po and 25 mcg oral loading dose  - GBS negative  - Fetal Well being - Category 1 - Pain control- per patient preference.  - Anticipate SVD    Gerald Leitz 03/04/2024, 5:55 PM

## 2024-03-04 NOTE — Progress Notes (Signed)
 This RN notified Gerald Leitz, MD about patient's tachysystole status and interventions done. Fetal HR is reassuring and patient is tolerating contractions appropriately. MD response was to leave pitocin at a rate of one.

## 2024-03-05 ENCOUNTER — Encounter (HOSPITAL_COMMUNITY): Payer: Self-pay | Admitting: Obstetrics and Gynecology

## 2024-03-05 ENCOUNTER — Encounter (HOSPITAL_COMMUNITY)
Admission: RE | Disposition: A | Discharge status: Home / Self Care | Payer: Self-pay | Source: Home / Self Care | Attending: Family Medicine

## 2024-03-05 ENCOUNTER — Inpatient Hospital Stay (HOSPITAL_COMMUNITY): Admitting: Anesthesiology

## 2024-03-05 DIAGNOSIS — Z3A40 40 weeks gestation of pregnancy: Secondary | ICD-10-CM

## 2024-03-05 LAB — CBC
HCT: 29.5 % — ABNORMAL LOW (ref 36.0–46.0)
Hemoglobin: 9.8 g/dL — ABNORMAL LOW (ref 12.0–15.0)
MCH: 29.5 pg (ref 26.0–34.0)
MCHC: 33.2 g/dL (ref 30.0–36.0)
MCV: 88.9 fL (ref 80.0–100.0)
Platelets: 248 10*3/uL (ref 150–400)
RBC: 3.32 MIL/uL — ABNORMAL LOW (ref 3.87–5.11)
RDW: 14 % (ref 11.5–15.5)
WBC: 17.3 10*3/uL — ABNORMAL HIGH (ref 4.0–10.5)
nRBC: 0 % (ref 0.0–0.2)

## 2024-03-05 LAB — RPR: RPR Ser Ql: NONREACTIVE

## 2024-03-05 SURGERY — Surgical Case
Anesthesia: Epidural

## 2024-03-05 MED ORDER — KETOROLAC TROMETHAMINE 30 MG/ML IJ SOLN
INTRAMUSCULAR | Status: AC
  Filled 2024-03-05: qty 1

## 2024-03-05 MED ORDER — LABETALOL HCL 5 MG/ML IV SOLN: 40.0000 mg | INTRAVENOUS | Status: DC | PRN

## 2024-03-05 MED ORDER — LACTATED RINGERS IV SOLN: INTRAVENOUS | Status: AC

## 2024-03-05 MED ORDER — SODIUM CHLORIDE 0.9 % IV SOLN
500.0000 mg | Freq: Once | INTRAVENOUS | Status: AC
  Administered 2024-03-05: 500 mg via INTRAVENOUS

## 2024-03-05 MED ORDER — MORPHINE SULFATE (PF) 0.5 MG/ML IJ SOLN
INTRAMUSCULAR | Status: AC
  Filled 2024-03-05: qty 10

## 2024-03-05 MED ORDER — KETOROLAC TROMETHAMINE 30 MG/ML IJ SOLN
30.0000 mg | Freq: Once | INTRAMUSCULAR | Status: AC | PRN
  Administered 2024-03-05: 30 mg via INTRAVENOUS

## 2024-03-05 MED ORDER — DIPHENHYDRAMINE HCL 25 MG PO CAPS
25.0000 mg | ORAL_CAPSULE | Freq: Four times a day (QID) | ORAL | Status: DC | PRN
  Filled 2024-03-05: qty 1

## 2024-03-05 MED ORDER — PHENYLEPHRINE 80 MCG/ML (10ML) SYRINGE FOR IV PUSH (FOR BLOOD PRESSURE SUPPORT)
80.0000 ug | PREFILLED_SYRINGE | INTRAVENOUS | Status: DC | PRN
  Filled 2024-03-05: qty 10

## 2024-03-05 MED ORDER — NALOXONE HCL 0.4 MG/ML IJ SOLN: 0.4000 mg | INTRAMUSCULAR | Status: DC | PRN

## 2024-03-05 MED ORDER — OXYCODONE HCL 5 MG PO TABS: 5.0000 mg | ORAL_TABLET | Freq: Once | ORAL | Status: DC | PRN

## 2024-03-05 MED ORDER — OXYTOCIN-SODIUM CHLORIDE 30-0.9 UT/500ML-% IV SOLN
INTRAVENOUS | Status: DC | PRN
  Administered 2024-03-05 (×2): 30 [IU] via INTRAVENOUS

## 2024-03-05 MED ORDER — SODIUM CHLORIDE 0.9 % IR SOLN
Status: DC | PRN
  Administered 2024-03-05: 1

## 2024-03-05 MED ORDER — EPHEDRINE 5 MG/ML INJ: 10.0000 mg | INTRAVENOUS | Status: DC | PRN

## 2024-03-05 MED ORDER — SODIUM CHLORIDE 0.9 % IV SOLN
INTRAVENOUS | Status: AC
  Filled 2024-03-05: qty 5

## 2024-03-05 MED ORDER — LIDOCAINE-EPINEPHRINE (PF) 2 %-1:200000 IJ SOLN
INTRAMUSCULAR | Status: AC
  Filled 2024-03-05: qty 20

## 2024-03-05 MED ORDER — ONDANSETRON HCL 4 MG/2ML IJ SOLN: 4.0000 mg | Freq: Three times a day (TID) | INTRAMUSCULAR | Status: DC | PRN

## 2024-03-05 MED ORDER — COCONUT OIL OIL
1.0000 | TOPICAL_OIL | Status: DC | PRN
  Administered 2024-03-07: 1 via TOPICAL

## 2024-03-05 MED ORDER — AMISULPRIDE (ANTIEMETIC) 5 MG/2ML IV SOLN: 10.0000 mg | Freq: Once | INTRAVENOUS | Status: DC | PRN

## 2024-03-05 MED ORDER — OXYCODONE HCL 5 MG PO TABS: 5.0000 mg | ORAL_TABLET | ORAL | Status: DC | PRN

## 2024-03-05 MED ORDER — STERILE WATER FOR IRRIGATION IR SOLN
Status: DC | PRN
  Administered 2024-03-05: 3000 mL

## 2024-03-05 MED ORDER — ACETAMINOPHEN 10 MG/ML IV SOLN
1000.0000 mg | Freq: Once | INTRAVENOUS | Status: DC | PRN
  Administered 2024-03-05: 1000 mg via INTRAVENOUS

## 2024-03-05 MED ORDER — LIDOCAINE HCL (PF) 1 % IJ SOLN
INTRAMUSCULAR | Status: DC | PRN
  Administered 2024-03-05: 11 mL via EPIDURAL

## 2024-03-05 MED ORDER — HYDRALAZINE HCL 20 MG/ML IJ SOLN: 10.0000 mg | INTRAMUSCULAR | Status: DC | PRN

## 2024-03-05 MED ORDER — LACTATED RINGERS IV SOLN: INTRAVENOUS | Status: DC | PRN

## 2024-03-05 MED ORDER — WITCH HAZEL-GLYCERIN EX PADS: 1.0000 | MEDICATED_PAD | CUTANEOUS | Status: DC | PRN

## 2024-03-05 MED ORDER — CEFAZOLIN SODIUM-DEXTROSE 2-3 GM-%(50ML) IV SOLR
INTRAVENOUS | Status: DC | PRN
  Administered 2024-03-05: 2 g via INTRAVENOUS

## 2024-03-05 MED ORDER — DEXAMETHASONE SODIUM PHOSPHATE 10 MG/ML IJ SOLN
INTRAMUSCULAR | Status: DC | PRN
  Administered 2024-03-05: 10 mg via INTRAVENOUS

## 2024-03-05 MED ORDER — FENTANYL-BUPIVACAINE-NACL 0.5-0.125-0.9 MG/250ML-% EP SOLN
12.0000 mL/h | EPIDURAL | Status: DC | PRN
  Administered 2024-03-05 (×2): 12 mL/h via EPIDURAL
  Filled 2024-03-05 (×2): qty 250

## 2024-03-05 MED ORDER — IBUPROFEN 600 MG PO TABS
600.0000 mg | ORAL_TABLET | Freq: Four times a day (QID) | ORAL | Status: DC
  Administered 2024-03-06 – 2024-03-07 (×3): 600 mg via ORAL
  Filled 2024-03-05 (×3): qty 1

## 2024-03-05 MED ORDER — KETOROLAC TROMETHAMINE 30 MG/ML IJ SOLN
30.0000 mg | Freq: Four times a day (QID) | INTRAMUSCULAR | Status: AC
  Administered 2024-03-06 (×3): 30 mg via INTRAVENOUS
  Filled 2024-03-05 (×3): qty 1

## 2024-03-05 MED ORDER — SCOPOLAMINE 1 MG/3DAYS TD PT72
MEDICATED_PATCH | TRANSDERMAL | Status: AC
  Filled 2024-03-05: qty 1

## 2024-03-05 MED ORDER — LABETALOL HCL 5 MG/ML IV SOLN: 80.0000 mg | INTRAVENOUS | Status: DC | PRN

## 2024-03-05 MED ORDER — ACETAMINOPHEN 10 MG/ML IV SOLN
INTRAVENOUS | Status: AC
  Filled 2024-03-05: qty 100

## 2024-03-05 MED ORDER — MORPHINE SULFATE (PF) 2 MG/ML IV SOLN: 1.0000 mg | INTRAVENOUS | Status: DC | PRN

## 2024-03-05 MED ORDER — FENTANYL CITRATE (PF) 100 MCG/2ML IJ SOLN: 25.0000 ug | INTRAMUSCULAR | Status: DC | PRN

## 2024-03-05 MED ORDER — NALOXONE HCL 4 MG/10ML IJ SOLN: 1.0000 ug/kg/h | INTRAVENOUS | Status: DC | PRN

## 2024-03-05 MED ORDER — OXYTOCIN-SODIUM CHLORIDE 30-0.9 UT/500ML-% IV SOLN
2.5000 [IU]/h | INTRAVENOUS | Status: AC
Start: 2024-03-05 — End: 2024-03-06

## 2024-03-05 MED ORDER — PHENYLEPHRINE 80 MCG/ML (10ML) SYRINGE FOR IV PUSH (FOR BLOOD PRESSURE SUPPORT)
PREFILLED_SYRINGE | INTRAVENOUS | Status: DC | PRN
  Administered 2024-03-05: 80 ug via INTRAVENOUS

## 2024-03-05 MED ORDER — ACETAMINOPHEN 500 MG PO TABS
1000.0000 mg | ORAL_TABLET | Freq: Four times a day (QID) | ORAL | Status: DC
  Administered 2024-03-06 – 2024-03-07 (×6): 1000 mg via ORAL
  Filled 2024-03-05 (×6): qty 2

## 2024-03-05 MED ORDER — SIMETHICONE 80 MG PO CHEW
80.0000 mg | CHEWABLE_TABLET | Freq: Three times a day (TID) | ORAL | Status: DC
  Administered 2024-03-06 – 2024-03-07 (×2): 80 mg via ORAL
  Filled 2024-03-05 (×4): qty 1

## 2024-03-05 MED ORDER — FENTANYL CITRATE (PF) 100 MCG/2ML IJ SOLN
INTRAMUSCULAR | Status: DC | PRN
  Administered 2024-03-05 (×2): 100 ug via EPIDURAL

## 2024-03-05 MED ORDER — SODIUM CHLORIDE 0.9% FLUSH: 3.0000 mL | INTRAVENOUS | Status: DC | PRN

## 2024-03-05 MED ORDER — SCOPOLAMINE 1 MG/3DAYS TD PT72
1.0000 | MEDICATED_PATCH | Freq: Once | TRANSDERMAL | Status: DC
  Administered 2024-03-05: 1.5 mg via TRANSDERMAL

## 2024-03-05 MED ORDER — SIMETHICONE 80 MG PO CHEW
80.0000 mg | CHEWABLE_TABLET | ORAL | Status: DC | PRN
  Administered 2024-03-06: 80 mg via ORAL

## 2024-03-05 MED ORDER — DIPHENHYDRAMINE HCL 50 MG/ML IJ SOLN: 12.5000 mg | INTRAMUSCULAR | Status: DC | PRN

## 2024-03-05 MED ORDER — PHENYLEPHRINE 80 MCG/ML (10ML) SYRINGE FOR IV PUSH (FOR BLOOD PRESSURE SUPPORT): 80.0000 ug | PREFILLED_SYRINGE | INTRAVENOUS | Status: DC | PRN

## 2024-03-05 MED ORDER — ZOLPIDEM TARTRATE 5 MG PO TABS: 5.0000 mg | ORAL_TABLET | Freq: Every evening | ORAL | Status: DC | PRN

## 2024-03-05 MED ORDER — LACTATED RINGERS IV SOLN
500.0000 mL | Freq: Once | INTRAVENOUS | Status: DC
Start: 2024-03-05 — End: 2024-03-05

## 2024-03-05 MED ORDER — MORPHINE SULFATE (PF) 0.5 MG/ML IJ SOLN
INTRAMUSCULAR | Status: DC | PRN
  Administered 2024-03-05: 3 mg via EPIDURAL

## 2024-03-05 MED ORDER — MEPERIDINE HCL 25 MG/ML IJ SOLN: 6.2500 mg | INTRAMUSCULAR | Status: DC | PRN

## 2024-03-05 MED ORDER — MENTHOL 3 MG MT LOZG: 1.0000 | LOZENGE | OROMUCOSAL | Status: DC | PRN

## 2024-03-05 MED ORDER — BUPIVACAINE HCL (PF) 0.25 % IJ SOLN
INTRAMUSCULAR | Status: DC | PRN
  Administered 2024-03-05: 5 mL via EPIDURAL
  Administered 2024-03-05: 10 mL via EPIDURAL
  Administered 2024-03-05: 5 mL via EPIDURAL

## 2024-03-05 MED ORDER — TRANEXAMIC ACID-NACL 1000-0.7 MG/100ML-% IV SOLN: 1000.0000 mg | INTRAVENOUS | Status: DC

## 2024-03-05 MED ORDER — ONDANSETRON HCL 4 MG/2ML IJ SOLN
INTRAMUSCULAR | Status: DC | PRN
  Administered 2024-03-05: 4 mg via INTRAVENOUS

## 2024-03-05 MED ORDER — PRENATAL MULTIVITAMIN CH
1.0000 | ORAL_TABLET | Freq: Every day | ORAL | Status: DC
  Administered 2024-03-06 – 2024-03-07 (×2): 1 via ORAL
  Filled 2024-03-05 (×2): qty 1

## 2024-03-05 MED ORDER — PHENYLEPHRINE 80 MCG/ML (10ML) SYRINGE FOR IV PUSH (FOR BLOOD PRESSURE SUPPORT)
PREFILLED_SYRINGE | INTRAVENOUS | Status: AC
  Filled 2024-03-05: qty 10

## 2024-03-05 MED ORDER — DIPHENHYDRAMINE HCL 25 MG PO CAPS
25.0000 mg | ORAL_CAPSULE | Freq: Four times a day (QID) | ORAL | Status: DC | PRN
  Administered 2024-03-06: 25 mg via ORAL

## 2024-03-05 MED ORDER — LABETALOL HCL 5 MG/ML IV SOLN: 20.0000 mg | INTRAVENOUS | Status: DC | PRN

## 2024-03-05 MED ORDER — SENNOSIDES-DOCUSATE SODIUM 8.6-50 MG PO TABS
2.0000 | ORAL_TABLET | Freq: Every day | ORAL | Status: DC
  Administered 2024-03-06 – 2024-03-07 (×2): 2 via ORAL
  Filled 2024-03-05 (×2): qty 2

## 2024-03-05 MED ORDER — CEFAZOLIN SODIUM-DEXTROSE 2-4 GM/100ML-% IV SOLN: 2.0000 g | Freq: Once | INTRAVENOUS | Status: DC

## 2024-03-05 MED ORDER — LIDOCAINE-EPINEPHRINE (PF) 2 %-1:200000 IJ SOLN
INTRAMUSCULAR | Status: DC | PRN
  Administered 2024-03-05 (×2): 5 mL via EPIDURAL

## 2024-03-05 MED ORDER — OXYCODONE HCL 5 MG/5ML PO SOLN: 5.0000 mg | Freq: Once | ORAL | Status: DC | PRN

## 2024-03-05 MED ORDER — DIPHENHYDRAMINE HCL 50 MG/ML IJ SOLN
12.5000 mg | Freq: Four times a day (QID) | INTRAMUSCULAR | Status: DC | PRN
  Administered 2024-03-05: 12.5 mg via INTRAVENOUS
  Filled 2024-03-05: qty 1

## 2024-03-05 MED ORDER — DIBUCAINE (PERIANAL) 1 % EX OINT: 1.0000 | TOPICAL_OINTMENT | CUTANEOUS | Status: DC | PRN

## 2024-03-05 MED ORDER — FENTANYL CITRATE (PF) 100 MCG/2ML IJ SOLN
INTRAMUSCULAR | Status: AC
  Filled 2024-03-05: qty 2

## 2024-03-05 MED ORDER — OXYTOCIN-SODIUM CHLORIDE 30-0.9 UT/500ML-% IV SOLN
INTRAVENOUS | Status: AC
  Filled 2024-03-05: qty 500

## 2024-03-05 SURGICAL SUPPLY — 36 items
BENZOIN TINCTURE PRP APPL 2/3 (GAUZE/BANDAGES/DRESSINGS) ×2 IMPLANT
CHLORAPREP W/TINT 26 (MISCELLANEOUS) ×4 IMPLANT
CLAMP UMBILICAL CORD (MISCELLANEOUS) ×2 IMPLANT
CLOTH BEACON ORANGE TIMEOUT ST (SAFETY) ×2 IMPLANT
DRAPE C SECTION CLR SCREEN (DRAPES) ×2 IMPLANT
DRSG OPSITE POSTOP 4X10 (GAUZE/BANDAGES/DRESSINGS) ×2 IMPLANT
ELECT REM PT RETURN 9FT ADLT (ELECTROSURGICAL) ×1 IMPLANT
ELECTRODE REM PT RTRN 9FT ADLT (ELECTROSURGICAL) ×2 IMPLANT
EXTRACTOR VACUUM KIWI (MISCELLANEOUS) IMPLANT
GAUZE PAD ABD 7.5X8 STRL (GAUZE/BANDAGES/DRESSINGS) IMPLANT
GAUZE SPONGE 4X4 12PLY STRL LF (GAUZE/BANDAGES/DRESSINGS) IMPLANT
GLOVE BIOGEL M 6.5 STRL (GLOVE) ×2 IMPLANT
GLOVE BIOGEL PI IND STRL 7.0 (GLOVE) ×6 IMPLANT
GOWN STRL REUS W/ TWL LRG LVL3 (GOWN DISPOSABLE) ×6 IMPLANT
KIT ABG SYR 3ML LUER SLIP (SYRINGE) IMPLANT
MAT PREVALON FULL STRYKER (MISCELLANEOUS) IMPLANT
NDL HYPO 25X5/8 SAFETYGLIDE (NEEDLE) IMPLANT
NEEDLE HYPO 25X5/8 SAFETYGLIDE (NEEDLE) IMPLANT
NS IRRIG 1000ML POUR BTL (IV SOLUTION) ×2 IMPLANT
PACK C SECTION WH (CUSTOM PROCEDURE TRAY) ×2 IMPLANT
PAD OB MATERNITY 4.3X12.25 (PERSONAL CARE ITEMS) ×2 IMPLANT
RTRCTR C-SECT PINK 25CM LRG (MISCELLANEOUS) ×2 IMPLANT
SET BERKELEY SUCTION TUBING (SUCTIONS) IMPLANT
SPONGE T-LAP 18X18 ~~LOC~~+RFID (SPONGE) IMPLANT
STRIP CLOSURE SKIN 1/2X4 (GAUZE/BANDAGES/DRESSINGS) ×2 IMPLANT
SUT MNCRL 0 VIOLET CTX 36 (SUTURE) ×4 IMPLANT
SUT MNCRL+ AB 3-0 CT1 36 (SUTURE) ×4 IMPLANT
SUT PDS AB 0 CTX 36 PDP370T (SUTURE) ×4 IMPLANT
SUT PLAIN 0 NONE (SUTURE) IMPLANT
SUT PLAIN ABS 2-0 CT1 27XMFL (SUTURE) IMPLANT
SUT VIC AB 2-0 CT1 TAPERPNT 27 (SUTURE) IMPLANT
SUT VIC AB 4-0 KS 27 (SUTURE) ×2 IMPLANT
TOWEL OR 17X24 6PK STRL BLUE (TOWEL DISPOSABLE) ×4 IMPLANT
TRAY FOLEY W/BAG SLVR 14FR LF (SET/KITS/TRAYS/PACK) ×2 IMPLANT
WATER STERILE IRR 1000ML POUR (IV SOLUTION) ×2 IMPLANT
YANKAUER SUCT BULB TIP NO VENT (SUCTIONS) IMPLANT

## 2024-03-05 NOTE — Anesthesia Preprocedure Evaluation (Signed)
 Anesthesia Evaluation  Patient identified by MRN, date of birth, ID band Patient awake    Reviewed: Allergy & Precautions, H&P , NPO status , Patient's Chart, lab work & pertinent test results, reviewed documented beta blocker date and time   Airway Mallampati: II  TM Distance: >3 FB Neck ROM: full    Dental no notable dental hx.    Pulmonary neg pulmonary ROS   Pulmonary exam normal breath sounds clear to auscultation       Cardiovascular Exercise Tolerance: Good hypertension,  Rhythm:regular Rate:Normal     Neuro/Psych negative neurological ROS  negative psych ROS   GI/Hepatic negative GI ROS, Neg liver ROS,,,  Endo/Other    Class 3 obesity  Renal/GU negative Renal ROS  negative genitourinary   Musculoskeletal   Abdominal  (+) + obese  Peds  Hematology negative hematology ROS (+)   Anesthesia Other Findings   Reproductive/Obstetrics (+) Pregnancy                             Anesthesia Physical Anesthesia Plan  ASA: 3  Anesthesia Plan: Epidural   Post-op Pain Management:    Induction:   PONV Risk Score and Plan: Treatment may vary due to age or medical condition  Airway Management Planned:   Additional Equipment:   Intra-op Plan:   Post-operative Plan:   Informed Consent: I have reviewed the patients History and Physical, chart, labs and discussed the procedure including the risks, benefits and alternatives for the proposed anesthesia with the patient or authorized representative who has indicated his/her understanding and acceptance.       Plan Discussed with:   Anesthesia Plan Comments: (  )        Anesthesia Quick Evaluation

## 2024-03-05 NOTE — Op Note (Signed)
 Gabriella Phillips PROCEDURE DATE: 03/05/2024  PREOPERATIVE DIAGNOSES: Intrauterine pregnancy at [redacted]w[redacted]d weeks gestation; failure to progress: arrest of dilation  POSTOPERATIVE DIAGNOSES: The same  PROCEDURE: Low Transverse Cesarean Section  SURGEON:  Dr. Salvadore Dom  ASSISTANT:  Dr. Connye Burkitt. An experienced assistant was required given the standard of surgical care given the complexity of the case.  This assistant was needed for exposure, dissection, suctioning, retraction, instrument exchange, assisting with delivery with administration of fundal pressure, and for overall help during the procedure.  ANESTHESIOLOGY TEAM: Anesthesia staff cannot be found from this context.  INDICATIONS: Gabriella Phillips is a 23 y.o. G1P0 at [redacted]w[redacted]d here for cesarean section secondary to the indications listed under preoperative diagnoses; please see preoperative note for further details.  The risks of surgery were discussed with the patient including but were not limited to: bleeding which may require transfusion or reoperation; infection which may require antibiotics; injury to bowel, bladder, ureters or other surrounding organs; injury to the fetus; need for additional procedures including hysterectomy in the event of a life-threatening hemorrhage; formation of adhesions; placental abnormalities wth subsequent pregnancies; incisional problems; thromboembolic phenomenon and other postoperative/anesthesia complications.  The patient concurred with the proposed plan, giving informed written consent for the procedure.    FINDINGS:  Viable female infant in cephalic OP presentation.  Apgars 8 and 9.  Clear amniotic fluid.  Intact placenta, three vessel cord.  Normal uterus, fallopian tubes and ovaries bilaterally.  ANESTHESIA: Epidural  INTRAVENOUS FLUIDS: 500 ml   ESTIMATED BLOOD LOSS: 867 ml URINE OUTPUT:  300 ml SPECIMENS: Placenta sent to L&D COMPLICATIONS: None immediate  PROCEDURE IN DETAIL:  The patient  preoperatively received intravenous antibiotics and had sequential compression devices applied to her lower extremities.  She was then taken to the operating room where the epidural anesthesia was dosed up to surgical level and was found to be adequate. She was then placed in a dorsal supine position with a leftward tilt, and prepped and draped in a sterile manner.  A foley catheter was placed into her bladder and attached to constant gravity.  After an adequate timeout was performed, a Pfannenstiel skin incision was made with scalpel and carried through to the underlying layer of fascia. The fascia was incised in the midline, and this incision was extended bilaterally using the Mayo scissors.  Kocher clamps were applied to the inferior aspect of the fascial incision and the underlying rectus muscles were dissected off bluntly and sharply. A similar process was carried out on the superior aspect of the fascial incision. The rectus muscles were separated in the midline and the peritoneum was entered bluntly. The Alexis self-retaining retractor was introduced into the abdominal cavity.  Attention was turned to the lower uterine segment where a low transverse hysterotomy was made with a scalpel and extended bilaterally bluntly.  The infant was successfully delivered, the cord was clamped and cut after one minute, and the infant was handed over to the awaiting neonatology team. Uterine massage was then administered, and the placenta delivered intact with a three-vessel cord. The uterus was exteriorize then cleared of clots and debris.  The hysterotomy was closed with 0 monocryl in a running locked fashion, and an imbricating layer was also placed with 0 monocryl. The abdomen was irrigated with warmed saline and cleared of clots. The pelvis was cleared of all clot and debris. Hemostasis was confirmed on all surfaces.  The retractor was removed.  The peritoneum was closed with a 2-0 Vicryl running stitch. The  fascia was  then closed using 0 monocryl in a running fashion x2, from lateral to medial bilaterally.  The subcutaneous layer was irrigated, re-approximated with 2-0 plain gut running stitches, and the skin was closed with a 4-0 Vicryl subcuticular stitch. The patient tolerated the procedure well. Sponge, instrument and needle counts were correct x 3.  She was taken to the recovery room in stable condition.   Gabriella Huge Autry-Lott, DO [facility] 03/05/2024, 5:21 PM

## 2024-03-05 NOTE — Anesthesia Postprocedure Evaluation (Signed)
 Anesthesia Post Note  Patient: Gabriella Phillips  Procedure(s) Performed: CESAREAN DELIVERY     Patient location during evaluation: PACU Anesthesia Type: Epidural Level of consciousness: awake Pain management: pain level controlled Vital Signs Assessment: post-procedure vital signs reviewed and stable Respiratory status: spontaneous breathing, nonlabored ventilation and respiratory function stable Cardiovascular status: stable Postop Assessment: no headache, no backache and epidural receding Anesthetic complications: no   No notable events documented.  Last Vitals:  Vitals:   03/05/24 2050 03/05/24 2052  BP:    Pulse: 99 (!) 106  Resp: 19 17  Temp:    SpO2: 100% 99%    Last Pain:  Vitals:   03/05/24 2044  TempSrc:   PainSc: 5    Pain Goal:    LLE Motor Response: Purposeful movement (03/05/24 2050) LLE Sensation: Tingling (03/05/24 2050) RLE Motor Response: Purposeful movement (03/05/24 2050) RLE Sensation: Tingling (03/05/24 2050)     Epidural/Spinal Function Cutaneous sensation: Tingles (03/05/24 2050), Patient able to flex knees: Yes (03/05/24 2050), Patient able to lift hips off bed: No (03/05/24 2050), Back pain beyond tenderness at insertion site: No (03/05/24 2050), Progressively worsening motor and/or sensory loss: No (03/05/24 2050), Bowel and/or bladder incontinence post epidural: No (03/05/24 2050)  Linton Rump

## 2024-03-05 NOTE — Progress Notes (Signed)
 Gabriella Phillips is a 23 y.o. G1P0 at [redacted]w[redacted]d by LMP admitted for induction of labor due to gestational hypertension .  Subjective: Patient is comfortable with her epidural . SROM at 0022 clear fluid. She denies headache visual disturbances or ruq pain.  Objective: BP 115/74   Pulse 85   Temp 97.8 F (36.6 C) (Oral)   Resp 16   Ht 5\' 5"  (1.651 m)   Wt 107 kg   SpO2 97%   BMI 39.27 kg/m  No intake/output data recorded. Total I/O In: -  Out: 425 [Urine:425]  FHT:  FHR: 130 bpm, variability: moderate,  accelerations:  Present,  decelerations:  Present variable  UC:   Irregular  SVE:   Dilation: 8 Effacement (%): 90 Station: 0 Exam by:: Dr. Gerald Leitz  Labs: Lab Results  Component Value Date   WBC 10.3 03/04/2024   HGB 10.8 (L) 03/04/2024   HCT 32.3 (L) 03/04/2024   MCV 88.0 03/04/2024   PLT 262 03/04/2024    Assessment / Plan: Induction of labor due to gestational hypertension,  progressing well on pitocin  Labor: Progressing normally Preeclampsia:  labs stable Fetal Wellbeing:  Category I and Category II overall reassuring  Pain Control:  Epidural I/D:  n/a Anticipated MOD:  NSVD  Gerald Leitz, MD 03/05/2024, 6:50 AM

## 2024-03-05 NOTE — Lactation Note (Signed)
 This note was copied from a baby's chart. Lactation Consultation Note  Patient Name: Gabriella Phillips Today's Date: 03/05/2024 Age:23 years Reason for consult: Initial assessment;1st time breastfeeding;Term;Breast reduction MOB informed LC she plans to formula only tonight and would like latch assistance in the morning. Infant was recently given 16 mls of 20 kcal formula less than 1 hour prior to [facility] entering the room. LC discussed and reviewed hand expression with breast model and MOB taught back hand expression. LC observed MOB nipples are flat but responds well  to stimulation. MOB would like hand pump to pre-pump breast prior to latching infant at the breast. LC sent referral for STORK DEBP for home use tonight. MOB will continue to feed infant every 2-3 hours. LC discussed the importance of maternal rest, meals and snacks. MOB was  made aware of O/P services, breastfeeding support groups, community resources, and our phone # for post-discharge questions.   Maternal Data Has patient been taught Hand Expression?: Yes Does the patient have breastfeeding experience prior to this delivery?: No  Feeding Mother's Current Feeding Choice: Breast Milk and Formula Nipple Type: Slow - flow  LATCH Score  LC did not observe latch, MOB would like to latch infant infant at the breast in the morning.                   Lactation Tools Discussed/Used    Interventions Interventions: Breast feeding basics reviewed;Skin to skin;Education;LC Services brochure;CDC milk storage guidelines;CDC Guidelines for Breast Pump Cleaning;Guidelines for Milk Supply and Pumping Schedule Handout  Discharge Pump:  (LC sent STORK PUMP referral .)  Consult Status Consult Status: Follow-up Date: 03/06/24 Follow-up type: In-patient    Frederico Hamman 03/05/2024, 10:58 PM

## 2024-03-05 NOTE — Transfer of Care (Signed)
 Immediate Anesthesia Transfer of Care Note  Patient: Gabriella Phillips  Procedure(s) Performed: CESAREAN DELIVERY  Patient Location: PACU  Anesthesia Type:Epidural  Level of Consciousness: awake, alert , and oriented  Airway & Oxygen Therapy: Patient Spontanous Breathing  Post-op Assessment: Report given to RN and Post -op Vital signs reviewed and stable  Post vital signs: Reviewed and stable  Last Vitals:  Vitals Value Taken Time  BP    Temp    Pulse 97 03/05/24 2004  Resp 16 03/05/24 2004  SpO2 100 % 03/05/24 2004  Vitals shown include unfiled device data.  Last Pain:  Vitals:   03/05/24 1717  TempSrc: Oral  PainSc: Asleep         Complications: No notable events documented.

## 2024-03-05 NOTE — Progress Notes (Signed)
 Labor Progress Note Gabriella Phillips is a 23 y.o. G1P0 at [redacted]w[redacted]d presented for IOL 2/2 gHTN.   S: No acute concerns. Comfortable with her epidural  O:  BP 118/79   Pulse 87   Temp 98.2 F (36.8 C) (Axillary)   Resp 18   Ht 5\' 5"  (1.651 m)   Wt 107 kg   SpO2 100%   BMI 39.27 kg/m  EFM: 140 bpm/moderate/+accels, no decels Toco:1-4 mins CVE: Dilation: Lip/rim Effacement (%): 90 Station: 0 Presentation: Vertex Exam by:: Ardeen Jourdain, RN   A&P: 23 y.o. G1P0 [redacted]w[redacted]d here for IOL 2/2 gHTN #Labor: Progressing well. Planning for vaginal delivery #Pain: Epidural #FWB: Cat I w/ periods of Cat II but overall reassuring  #GBS negative  gHTN BP wnl. -continue to monitor  Noah Pelaez Autry-Lott, DO 10:52 AM

## 2024-03-05 NOTE — Progress Notes (Signed)
 This RN notified OB anesthesiologist about labs drawn at 1300 4/3 and patient being IOL for GHPT. OB anesthesiologist willing to proceed.

## 2024-03-05 NOTE — Progress Notes (Addendum)
 Labor Progress Note Gabriella Phillips is a 23 y.o. G1P0 at [redacted]w[redacted]d presented for IOL 2/2 gHTN.   S: No acute concerns. Comfortable with her epidural  O:  BP 133/86   Pulse 93   Temp 98.2 F (36.8 C) (Axillary)   Resp 18   Ht 5\' 5"  (1.651 m)   Wt 107 kg   SpO2 100%   BMI 39.27 kg/m  EFM: 150 bpm/moderate/+accels, no decels, occasional variable Toco:1-4 mins CVE: Dilation: 9 Effacement (%): 90 Station: 0 Presentation: Vertex Exam by:: Chales Abrahams RN   A&P: 23 y.o. G1P0 [redacted]w[redacted]d here for IOL 2/2 gHTN #Labor: Unchanged since 8am. Pitocin at 7. IUPC placed. Assess MVUs (140 at this time) and titrate pit accordingly and as tolerated by fetus. I discussed with the patient that we will need to strongly consider a c-section if unchanged on her next exam or if fetal intolerance persistent with pitocin titration. She voiced understanding. Plan for next cervical exam 2 hours following adequate contractions.   The risks of surgery were discussed with the patient including but were not limited to: bleeding which may require transfusion or reoperation; infection which may require antibiotics; injury to bowel, bladder, ureters or other surrounding organs; injury to the fetus; need for additional procedures including hysterectomy in the event of a life-threatening hemorrhage; formation of adhesions; placental abnormalities wth subsequent pregnancies; incisional problems; thromboembolic phenomenon and other postoperative/anesthesia complications.   #Pain: Epidural #FWB: Cat I w/ periods of Cat II but overall reassuring  #GBS negative  gHTN BP wnl.  -continue to monitor  Meghna Hagmann Autry-Lott, DO 1:02 PM

## 2024-03-05 NOTE — Anesthesia Procedure Notes (Signed)
 Epidural Patient location during procedure: OB Start time: 03/05/2024 2:34 AM End time: 03/05/2024 2:48 AM  Staffing Anesthesiologist: Lowella Curb, MD Performed: anesthesiologist   Preanesthetic Checklist Completed: patient identified, IV checked, site marked, risks and benefits discussed, surgical consent, monitors and equipment checked, pre-op evaluation and timeout performed  Epidural Patient position: sitting Prep: ChloraPrep Patient monitoring: heart rate, cardiac monitor, continuous pulse ox and blood pressure Approach: midline Location: L2-L3 Injection technique: LOR saline  Needle:  Needle type: Tuohy  Needle gauge: 17 G Needle length: 9 cm Needle insertion depth: 8 cm Catheter type: closed end flexible Catheter size: 20 Guage Catheter at skin depth: 12 cm Test dose: negative  Assessment Events: blood not aspirated, injection not painful, no injection resistance, no paresthesia and negative IV test  Additional Notes Reason for block:procedure for pain

## 2024-03-05 NOTE — Progress Notes (Signed)
 Per the patient last cervical exam, her labor has not progressed since 8am this morning. As previously discussed we will proceed with c-section 2/2 arrest of dilation.   She is currently comfortable with her epidural and pitocin has been discontinued.   The patient concurred with the proposed plan, giving informed written consent for the procedures.  Antibiotics ordered.  To OR when ready.   Lavonda Jumbo, DO [facility] 03/05/2024, 5:19 PM

## 2024-03-06 LAB — COMPREHENSIVE METABOLIC PANEL WITH GFR
ALT: 11 U/L (ref 0–44)
AST: 20 U/L (ref 15–41)
Albumin: 2.2 g/dL — ABNORMAL LOW (ref 3.5–5.0)
Alkaline Phosphatase: 107 U/L (ref 38–126)
Anion gap: 8 (ref 5–15)
BUN: <5 mg/dL — ABNORMAL LOW (ref 6–20)
CO2: 20 mmol/L — ABNORMAL LOW (ref 22–32)
Calcium: 8.6 mg/dL — ABNORMAL LOW (ref 8.9–10.3)
Chloride: 108 mmol/L (ref 98–111)
Creatinine, Ser: 0.82 mg/dL (ref 0.44–1.00)
GFR, Estimated: >60 mL/min (ref >60)
Glucose, Bld: 85 mg/dL (ref 70–99)
Potassium: 3.6 mmol/L (ref 3.5–5.1)
Sodium: 136 mmol/L (ref 135–145)
Total Bilirubin: 0.6 mg/dL (ref 0.0–1.2)
Total Protein: 5.1 g/dL — ABNORMAL LOW (ref 6.5–8.1)

## 2024-03-06 LAB — CBC
HCT: 27.3 % — ABNORMAL LOW (ref 36.0–46.0)
Hemoglobin: 9 g/dL — ABNORMAL LOW (ref 12.0–15.0)
MCH: 29.4 pg (ref 26.0–34.0)
MCHC: 33 g/dL (ref 30.0–36.0)
MCV: 89.2 fL (ref 80.0–100.0)
Platelets: 220 10*3/uL (ref 150–400)
RBC: 3.06 MIL/uL — ABNORMAL LOW (ref 3.87–5.11)
RDW: 14 % (ref 11.5–15.5)
WBC: 13 10*3/uL — ABNORMAL HIGH (ref 4.0–10.5)
nRBC: 0 % (ref 0.0–0.2)

## 2024-03-06 LAB — LACTATE DEHYDROGENASE: LDH: 201 U/L — ABNORMAL HIGH (ref 98–192)

## 2024-03-06 MED ORDER — FERROUS SULFATE 325 (65 FE) MG PO TABS
325.0000 mg | ORAL_TABLET | ORAL | Status: DC
  Administered 2024-03-06: 325 mg via ORAL
  Filled 2024-03-06: qty 1

## 2024-03-06 NOTE — Lactation Note (Signed)
 This note was copied from a baby's chart. Lactation Consultation Note  Patient Name: Gabriella Phillips Today's Date: 03/06/2024 Age:23 hours Reason for consult: Follow-up assessment;Mother's request;Primapara;1st time breastfeeding;Term;Breast reduction  P1- LC Beverly saw MOB earlier today and asked this LC to check in on her and set her up with a DEBP. MOB's visitor was leaving as LC entered the room for the consult. MOB reports that she needs to eat first, then she will call for LC to set up the DEBP. LC encouraged MOB to call when she is ready for a consult.  Maternal Data Has patient been taught Hand Expression?: No Does the patient have breastfeeding experience prior to this delivery?: No  Feeding Mother's Current Feeding Choice: Breast Milk and Formula Nipple Type: Nfant Standard Flow (white)  Interventions Interventions: Breast feeding basics reviewed;Education;LC Services brochure  Discharge Discharge Education: Engorgement and breast care;Warning signs for feeding baby Pump: Referral sent for Stork Pump [facility] Robin sent referral last night)  Consult Status Consult Status: Follow-up Date: 03/07/24 Follow-up type: In-patient    Dema Severin BS, IBCLC 03/06/2024, 5:28 PM

## 2024-03-06 NOTE — Progress Notes (Signed)
 Postpartum Note Day #1  S:  Patient doing well.  Pain controlled.  Tolerating regular diet.   Ambulating and voiding without difficulty.   Denies fevers, chills, chest pain, SOB, N/V, or worsening bilateral LE edema.  Lochia: Minimal Infant feeding:  Breast/formula Circumcision:  N/A, female infant Contraception:  To be discussed at PPV  O: Temp:  [98 F (36.7 C)-99.3 F (37.4 C)] 98.8 F (37.1 C) (04/05 0327) Pulse Rate:  [87-111] 96 (04/05 0327) Resp:  [16-21] 18 (04/05 0327) BP: (118-167)/(75-96) 126/87 (04/05 0327) SpO2:  [99 %-100 %] 99 % (04/05 0327) Gen: NAD, pleasant and cooperative CV: Regular rate Resp: Normal work of breathing Abdomen: soft, non-distended, non-tender throughout Uterus: firm, non-tender, below umbilicus Incision: c/d/i, pressure bandage in place  Ext: Trace bilateral LE edema, no bilateral calf tenderness, SCDs on and working  Labs:  Recent Labs    03/05/24 2057 03/06/24 0425  HGB 9.8* 9.0*  HCT 29.5* 27.3*    A/P: Patient is a 23 y.o. G1P1001 POD#1 s/p LTCS (failure to progress).  S/p LTCS - Pain well controlled  - GU: UOP is adequate - GI: Tolerating regular diet - Activity: encouraged sitting up to chair and ambulation as tolerated - DVT Prophylaxis: Ambulation, SCDs in bed - Labs: as above, oral iron ordered  Gestational HTN - Mild range Bps, preeclampsia labs unremarkable, PCR 0.16 - Medication: None - Blood pressures normotensive postpartum ranging 120s-130s/80s - Will need outpatient BP check   Disposition:  D/C home POD#2-3.   Steva Ready, DO

## 2024-03-07 MED ORDER — FERROUS SULFATE 325 (65 FE) MG PO TABS: 325.0000 mg | ORAL_TABLET | Freq: Two times a day (BID) | ORAL | 3 refills | Status: AC

## 2024-03-07 MED ORDER — IBUPROFEN 600 MG PO TABS: 600.0000 mg | ORAL_TABLET | Freq: Four times a day (QID) | ORAL | 1 refills | Status: DC

## 2024-03-07 MED ORDER — OXYCODONE HCL 5 MG PO TABS
5.0000 mg | ORAL_TABLET | Freq: Four times a day (QID) | ORAL | 0 refills | Status: DC | PRN
Start: 2024-03-07 — End: 2024-06-02

## 2024-03-07 NOTE — Discharge Summary (Signed)
 Postpartum Discharge Summary  Date of Service: 03/07/24      Patient Name: Gabriella Phillips DOB: 11-05-2001 MRN: [id number]  Date of admission: 03/04/2024 Delivery date:03/05/2024 Delivering provider: Lavonda Jumbo Date of discharge: 03/07/2024  Admitting diagnosis: Gestational hypertension [O13.9] Encounter for induction of labor Intrauterine pregnancy: [redacted]w[redacted]d     Secondary diagnosis:  Principal Problem:   Gestational hypertension Failure to progress Additional problems: HSV (no lesions/outbreak)    Discharge diagnosis: Term Pregnancy Delivered and Gestational Hypertension                                              Post partum procedures: None Augmentation: Pitocin and Cytotec Complications: None  Hospital course: Induction of Labor With Cesarean Section   23 y.o. yo G1P1001 at [redacted]w[redacted]d was admitted to the hospital 03/04/2024 for induction of labor. Patient had a labor course significant for failure to progress. The patient went for cesarean section due to  Failure to progress . Delivery details are as follows: Membrane Rupture Time/Date: 12:20 AM,03/05/2024  Delivery Method:C-Section, Low Transverse Operative Delivery:N/A Details of operation can be found in separate operative Note.  Patient had a postpartum course complicated by nothing. She is ambulating, tolerating a regular diet, passing flatus, and urinating well.  Patient is discharged home in stable condition on 03/07/24.      Newborn Data: Birth date:03/05/2024 Birth time:6:57 PM Gender:Female Living status:Living Apgars:8 ,9  Weight:3820 g                               Magnesium Sulfate received: No BMZ received: No Rhophylac:N/A MMR:N/A T-DaP:Given prenatally Flu: Yes RSV Vaccine received: No Transfusion:No Immunizations administered: Immunization History  Administered Date(s) Administered   DTaP 08/17/1999, 10/29/1999, 01/31/2000, 12/15/2000, 09/23/2003   HIB (PRP-OMP) 08/17/1999, 10/29/1999, 01/31/2000,  12/15/2000   HPV 9-valent 04/21/2018, 06/23/2018, 10/23/2018   Hepatitis A 03/25/2006, 02/26/2007   Hepatitis B November 25, 1999, 08/17/1999, 05/01/2000   Hpv-Unspecified 06/11/2012, 07/19/2014   IPV 08/17/1999, 10/29/1999, 07/09/2000, 09/23/2003   Influenza,inj,Quad PF,6+ Mos 12/14/2018   MMR 07/09/2000, 09/23/2003   Meningococcal Conjugate 07/04/2010   Meningococcal polysaccharide vaccine (MPSV4) 07/09/2000   PFIZER(Purple Top)SARS-COV-2 Vaccination 09/19/2020   Pneumococcal Conjugate-13 10/29/1999, 01/31/2000, 05/01/2000, 07/09/2000   Tdap 07/04/2010, 12/14/2018   Varicella 07/09/2000, 06/29/2008    Physical exam  Vitals:   03/06/24 2020 03/06/24 2256 03/07/24 0048 03/07/24 0520  BP: (!) 121/90 121/75 123/85 120/67  Pulse: 76   66  Resp: 18   18  Temp: 98 F (36.7 C)   97.9 F (36.6 C)  TempSrc: Oral   Oral  SpO2: 100%   99%  Weight:      Height:       General: alert, cooperative, and no distress Lochia: appropriate Uterine Fundus: firm Incision: Dressing is clean, dry, and intact DVT Evaluation: No evidence of DVT seen on physical exam. No cords or calf tenderness. Labs: Lab Results  Component Value Date   WBC 13.0 (H) 03/06/2024   HGB 9.0 (L) 03/06/2024   HCT 27.3 (L) 03/06/2024   MCV 89.2 03/06/2024   PLT 220 03/06/2024      Latest Ref Rng & Units 03/06/2024    4:25 AM  CMP  Glucose 70 - 99 mg/dL 85   BUN 6 - 20 mg/dL <5   Creatinine 9.14 - 1.00 mg/dL  0.82   Sodium 135 - 145 mmol/L 136   Potassium 3.5 - 5.1 mmol/L 3.6   Chloride 98 - 111 mmol/L 108   CO2 22 - 32 mmol/L 20   Calcium 8.9 - 10.3 mg/dL 8.6   Total Protein 6.5 - 8.1 g/dL 5.1   Total Bilirubin 0.0 - 1.2 mg/dL 0.6   Alkaline Phos 38 - 126 U/L 107   AST 15 - 41 U/L 20   ALT 0 - 44 U/L 11    Edinburgh Score:    03/06/2024    8:35 AM  Edinburgh Postnatal Depression Scale Screening Tool  I have been able to laugh and see the funny side of things. 0  I have looked forward with enjoyment to things.  0  I have blamed myself unnecessarily when things went wrong. 2  I have been anxious or worried for no good reason. 2  I have felt scared or panicky for no good reason. 0  Things have been getting on top of me. 0  I have been so unhappy that I have had difficulty sleeping. 0  I have felt sad or miserable. 0  I have been so unhappy that I have been crying. 0  The thought of harming myself has occurred to me. 0  Edinburgh Postnatal Depression Scale Total 4      After visit meds:  Allergies as of 03/07/2024   No Known Allergies      Medication List     STOP taking these medications    cefadroxil 500 MG capsule Commonly known as: DURICEF   ondansetron 4 MG disintegrating tablet Commonly known as: ZOFRAN-ODT   potassium chloride SA 20 MEQ tablet Commonly known as: KLOR-CON M   valACYclovir 1000 MG tablet Commonly known as: VALTREX       TAKE these medications    ferrous sulfate 325 (65 FE) MG tablet Take 1 tablet (325 mg total) by mouth 2 (two) times daily with a meal.   ibuprofen 600 MG tablet Commonly known as: ADVIL Take 1 tablet (600 mg total) by mouth every 6 (six) hours.   multivitamin-prenatal 27-0.8 MG Tabs tablet Take 1 tablet by mouth daily at 12 noon.   oxyCODONE 5 MG immediate release tablet Commonly known as: Oxy IR/ROXICODONE Take 1-2 tablets (5-10 mg total) by mouth every 6 (six) hours as needed for breakthrough pain.         Discharge home in stable condition Infant Feeding: Breast Infant Disposition:home with mother Discharge instruction: per After Visit Summary and Postpartum booklet. Activity: Advance as tolerated. Pelvic rest for 6 weeks.  Diet: routine diet Anticipated Birth Control:  To be discussed at PPV. Postpartum Appointment:6 weeks Additional Postpartum F/U: Incision check 2 weeks and BP check 1 week Future Appointments:No future appointments. Follow up Visit:  Follow-up Information     Gerald Leitz, MD Follow up in 1  week(s).   Specialty: Obstetrics and Gynecology Why: Our office will call to schedule a blood pressure check this week as well as a 2 week incision check and your 6 week postpartum visit. Contact information: [address] [place] [address] [phone]                     03/07/2024 Steva Ready, DO

## 2024-03-07 NOTE — Progress Notes (Addendum)
 RN called Connye Burkitt MD regarding patient 's increase in Blood pressures . Per MD call if patient's BP is equal to or greater than 150/110.

## 2024-03-07 NOTE — Progress Notes (Signed)
 Postpartum Note Day #2  S:  Patient doing well.  Pain controlled.  Tolerating regular diet.   Ambulating and voiding without difficulty.   Feeling really good and would be okay with discharge home today if infant is discharged. Just got out of the shower. Denies fevers, chills, chest pain, SOB, N/V, or worsening bilateral LE edema.  Lochia: Minimal Infant feeding:  Breast/formula Circumcision:  N/A, female infant Contraception:  To be discussed at PPV  O: Temp:  [97.9 F (36.6 C)-98 F (36.7 C)] 97.9 F (36.6 C) (04/06 0520) Pulse Rate:  [66-77] 66 (04/06 0520) Resp:  [18] 18 (04/06 0520) BP: (120-128)/(67-90) 120/67 (04/06 0520) SpO2:  [99 %-100 %] 99 % (04/06 0520) Gen: NAD, pleasant and cooperative CV: Regular rate Resp: Normal work of breathing Abdomen: soft, non-distended, non-tender throughout Uterus: firm, non-tender, below umbilicus Incision: c/d/i, pressure bandage in place which was removed, honeycomb dressing in place with a small area of saturation but otherwise clean/dry/intact Ext: Trace bilateral LE edema, no bilateral calf tenderness, SCDs on and working  Labs:  Recent Labs    03/05/24 2057 03/06/24 0425  HGB 9.8* 9.0*  HCT 29.5* 27.3*    A/P: Patient is a 23 y.o. G1P1001 POD#2 s/p LTCS (failure to progress).  S/p LTCS - Pain well controlled  - GU: UOP is adequate - GI: Tolerating regular diet - Activity: encouraged sitting up to chair and ambulation as tolerated - DVT Prophylaxis: Ambulation, SCDs in bed - Labs: as above, oral iron ordered  Gestational HTN - Mild range Bps, preeclampsia labs unremarkable, PCR 0.16 - Medication: None - Blood pressures normotensive postpartum ranging 120s-130s/80s, one diastolic of 90 at 8:20 last night - Will arrange outpatient BP check this week  Disposition:  D/C home today if infant is discharged home.  Steva Ready, DO

## 2024-03-07 NOTE — Discharge Instructions (Signed)
 WHAT TO LOOK OUT FOR: Fever of 100.4 or above Mastitis: feels like flu and breasts hurt Infection: increased pain, swelling or redness Blood clots golf ball size or larger Postpartum depression   Congratulations on your newest addition!

## 2024-03-07 NOTE — Lactation Note (Addendum)
 This note was copied from a baby's chart. Lactation Consultation Note  Patient Name: Gabriella Phillips Today's Date: 03/07/2024 Age:25 hours  Reason for consult: Follow-up assessment;Primapara;1st time breastfeeding;Breast reduction;Term;Mother's request  P1, [redacted]w[redacted]d, 2.75% weight loss  Mother has been formula feeding. She wants to "try" breastfeeding and has attempted to latch baby stating baby is not interested/ sleepy. Mother does want to pump and provide EBM for her baby. Mother had a breast reduction with reattachment of the nipple/areola complex. She reports having some breast fullness in pregnancy and she can hand express colostrum. Baby was just fed a bottle and is currently sleeping in her crib.  DEBP set up and instructed mother on the use, frequency, cleaning of breast pump and storage of breast milk. She was fitted with a 21 mm flanges and provided coconut oil for comfort when pumping. Mother currently denies discomfort with this pumping session. She has a breast pump for home. Education discussed regarding breast reduction surgery and lactation. Depending on the removal of tissue, ducts and nerves with surgery can effect milk making process often reducing the amount of milk produced. Encouraged skin to skin, latching, and pumping every 3 hours, both breast, for 15 minutes for stimulation of milk supply. Advised to continue to supplement with formula. Weight checks and an OP lactation consultant will  help determine how much milk baby is taking from the breast with latching and assess weight gain.  Mom made aware of O/P services, breastfeeding support groups, community resources, and our phone # for post-discharge questions. Mother will seek OP lacation support, as needed.      Feeding Mother's Current Feeding Choice: Formula Nipple Type: Nfant Standard Flow (white)  LATCH Score Latch: Too sleepy or reluctant, no latch achieved, no sucking elicited.    Lactation Tools  Discussed/Used Tools: Pump;Flanges;Coconut oil Flange Size: 21 Breast pump type: Double-Electric Breast Pump Pump Education: Setup, frequency, and cleaning;Milk Storage Reason for Pumping: stimulate milk production, wants to pump and give EBM Pumping frequency: every 3 hrs for 15 min in the intiate setting  Interventions Interventions: Breast feeding basics reviewed;DEBP;Hand pump;Coconut oil;Education;LC Services brochure;CDC milk storage guidelines;CDC Guidelines for Breast Pump Cleaning  Discharge Discharge Education: Warning signs for feeding baby;Outpatient recommendation;Engorgement and breast care Pump: DEBP;Manual;Employee Pump  Consult Status Consult Status: Follow-up Date: 03/08/24 Follow-up type: In-patient    Christella Hartigan M 03/07/2024, 9:59 AM

## 2024-03-08 ENCOUNTER — Encounter (HOSPITAL_COMMUNITY): Payer: Self-pay | Admitting: Family Medicine

## 2024-03-11 ENCOUNTER — Other Ambulatory Visit (HOSPITAL_COMMUNITY): Payer: Self-pay

## 2024-03-11 ENCOUNTER — Other Ambulatory Visit: Payer: Self-pay

## 2024-03-11 ENCOUNTER — Inpatient Hospital Stay (HOSPITAL_COMMUNITY)

## 2024-03-11 MED ORDER — NIFEDIPINE ER OSMOTIC RELEASE 30 MG PO TB24
30.0000 mg | ORAL_TABLET | Freq: Every day | ORAL | 3 refills | Status: DC
  Filled 2024-03-11: qty 30, 30d supply, fill #0

## 2024-03-15 ENCOUNTER — Inpatient Hospital Stay (HOSPITAL_COMMUNITY)
Admission: AD | Admit: 2024-03-15 | Discharge: 2024-03-19 | DRG: 776 | Disposition: A | Discharge status: Home / Self Care | Attending: Obstetrics and Gynecology | Admitting: Obstetrics and Gynecology

## 2024-03-15 ENCOUNTER — Telehealth (HOSPITAL_COMMUNITY): Payer: Self-pay | Admitting: *Deleted

## 2024-03-15 DIAGNOSIS — O1415 Severe pre-eclampsia, complicating the puerperium: Secondary | ICD-10-CM | POA: Diagnosis not present

## 2024-03-15 DIAGNOSIS — R030 Elevated blood-pressure reading, without diagnosis of hypertension: Secondary | ICD-10-CM | POA: Diagnosis not present

## 2024-03-15 DIAGNOSIS — Z8249 Family history of ischemic heart disease and other diseases of the circulatory system: Secondary | ICD-10-CM

## 2024-03-15 DIAGNOSIS — Z79899 Other long term (current) drug therapy: Secondary | ICD-10-CM

## 2024-03-15 DIAGNOSIS — O1495 Unspecified pre-eclampsia, complicating the puerperium: Principal | ICD-10-CM | POA: Diagnosis present

## 2024-03-15 LAB — CBC
HCT: 29.9 % — ABNORMAL LOW (ref 36.0–46.0)
Hemoglobin: 9.9 g/dL — ABNORMAL LOW (ref 12.0–15.0)
MCH: 29.4 pg (ref 26.0–34.0)
MCHC: 33.1 g/dL (ref 30.0–36.0)
MCV: 88.7 fL (ref 80.0–100.0)
Platelets: 381 10*3/uL (ref 150–400)
RBC: 3.37 MIL/uL — ABNORMAL LOW (ref 3.87–5.11)
RDW: 13.3 % (ref 11.5–15.5)
WBC: 9.2 10*3/uL (ref 4.0–10.5)
nRBC: 0 % (ref 0.0–0.2)

## 2024-03-15 NOTE — Telephone Encounter (Signed)
 03/15/2024  Name: Gabriella Phillips MRN: [id number] DOB: [birth year]  Reason for Call:  Transition of Care Hospital Discharge Call  Contact Status: Patient Contact Status: Complete  Language assistant needed:          Follow-Up Questions: Do You Have Any Concerns About Your Health As You Heal From Delivery?: No Do You Have Any Concerns About Your Infants Health?: No  Edinburgh Postnatal Depression Scale:  In the Past 7 Days:   Patient declined. Stated, "Everything is good." PHQ2-9 Depression Scale:     Discharge Follow-up: Edinburgh score requires follow up?: N/A Patient was advised of the following resources:: Breastfeeding Support Group, Support Group  Post-discharge interventions: Reviewed Newborn Safe Sleep Practices  Signature Julien Odor, RN, 03/15/24, [phone]

## 2024-03-15 NOTE — MAU Note (Signed)
 Pt says she del C/S on Friday 03-05-2024- induced for elevated BP's . -  No meds for BP  Went home on  Sunday 03-07-2024- No meds  for BP. Had bad H/A last Thursday . Had appointment - BP- 160/116 - Dr - called in  Rx for Procardia- . Then Friday  11th- had H/A - called Dr - changed Rx - to Labetalol - BID . Today - took at 0730 and 830pm So BP in car  in parking lot was- 163/115 with cuff.  Has H/A - started  7pm-  2/10- no meds . Now- H/A- 2/10, no vision changes , no epigastric pain .

## 2024-03-16 ENCOUNTER — Encounter (HOSPITAL_COMMUNITY): Payer: Self-pay

## 2024-03-16 ENCOUNTER — Encounter (HOSPITAL_COMMUNITY): Payer: Self-pay | Admitting: Obstetrics and Gynecology

## 2024-03-16 ENCOUNTER — Other Ambulatory Visit (HOSPITAL_COMMUNITY): Payer: Self-pay

## 2024-03-16 ENCOUNTER — Other Ambulatory Visit: Payer: Self-pay

## 2024-03-16 DIAGNOSIS — O1415 Severe pre-eclampsia, complicating the puerperium: Secondary | ICD-10-CM | POA: Diagnosis not present

## 2024-03-16 DIAGNOSIS — Z79899 Other long term (current) drug therapy: Secondary | ICD-10-CM | POA: Diagnosis not present

## 2024-03-16 DIAGNOSIS — O1495 Unspecified pre-eclampsia, complicating the puerperium: Principal | ICD-10-CM

## 2024-03-16 DIAGNOSIS — Z8249 Family history of ischemic heart disease and other diseases of the circulatory system: Secondary | ICD-10-CM | POA: Diagnosis not present

## 2024-03-16 DIAGNOSIS — R030 Elevated blood-pressure reading, without diagnosis of hypertension: Secondary | ICD-10-CM | POA: Diagnosis present

## 2024-03-16 LAB — MAGNESIUM: Magnesium: 5.4 mg/dL — ABNORMAL HIGH (ref 1.7–2.4)

## 2024-03-16 LAB — COMPREHENSIVE METABOLIC PANEL WITH GFR
ALT: 16 U/L (ref 0–44)
AST: 18 U/L (ref 15–41)
Albumin: 3 g/dL — ABNORMAL LOW (ref 3.5–5.0)
Alkaline Phosphatase: 85 U/L (ref 38–126)
Anion gap: 7 (ref 5–15)
BUN: 11 mg/dL (ref 6–20)
CO2: 23 mmol/L (ref 22–32)
Calcium: 9.1 mg/dL (ref 8.9–10.3)
Chloride: 107 mmol/L (ref 98–111)
Creatinine, Ser: 0.89 mg/dL (ref 0.44–1.00)
GFR, Estimated: >60 mL/min (ref >60)
Glucose, Bld: 97 mg/dL (ref 70–99)
Potassium: 3.7 mmol/L (ref 3.5–5.1)
Sodium: 137 mmol/L (ref 135–145)
Total Bilirubin: 0.4 mg/dL (ref 0.0–1.2)
Total Protein: 6.4 g/dL — ABNORMAL LOW (ref 6.5–8.1)

## 2024-03-16 MED ORDER — LABETALOL HCL 5 MG/ML IV SOLN
40.0000 mg | INTRAVENOUS | Status: DC | PRN
Start: 2024-03-16 — End: 2024-03-20

## 2024-03-16 MED ORDER — MAGNESIUM SULFATE 40 GM/1000ML IV SOLN
2.0000 g/h | INTRAVENOUS | Status: AC
  Administered 2024-03-16 (×2): 2 g/h via INTRAVENOUS
  Filled 2024-03-16 (×2): qty 1000

## 2024-03-16 MED ORDER — HYDRALAZINE HCL 20 MG/ML IJ SOLN
5.0000 mg | INTRAMUSCULAR | Status: DC | PRN
  Administered 2024-03-16: 5 mg via INTRAVENOUS
  Filled 2024-03-16: qty 1

## 2024-03-16 MED ORDER — LABETALOL HCL 5 MG/ML IV SOLN: 20.0000 mg | INTRAVENOUS | Status: DC | PRN

## 2024-03-16 MED ORDER — MAGNESIUM SULFATE BOLUS VIA INFUSION
4.0000 g | Freq: Once | INTRAVENOUS | Status: AC
  Administered 2024-03-16: 4 g via INTRAVENOUS
  Filled 2024-03-16: qty 1000

## 2024-03-16 MED ORDER — HYDRALAZINE HCL 20 MG/ML IJ SOLN
10.0000 mg | INTRAMUSCULAR | Status: DC | PRN
  Administered 2024-03-16: 10 mg via INTRAVENOUS
  Filled 2024-03-16: qty 1

## 2024-03-16 MED ORDER — LABETALOL HCL 5 MG/ML IV SOLN: 80.0000 mg | INTRAVENOUS | Status: DC | PRN

## 2024-03-16 MED ORDER — HYDRALAZINE HCL 20 MG/ML IJ SOLN: 10.0000 mg | INTRAMUSCULAR | Status: DC | PRN

## 2024-03-16 MED ORDER — ACETAMINOPHEN 500 MG PO TABS
1000.0000 mg | ORAL_TABLET | Freq: Four times a day (QID) | ORAL | Status: DC | PRN
  Administered 2024-03-16: 1000 mg via ORAL
  Filled 2024-03-16: qty 2

## 2024-03-16 MED ORDER — LACTATED RINGERS IV SOLN: INTRAVENOUS | Status: AC

## 2024-03-16 NOTE — Progress Notes (Signed)
 POSTPARTUM PROGRESS NOTE  POD #11  Subjective:  Gabriella Phillips is a 23 y.o. G1P1001 s/p pLTCS at [redacted]w[redacted]d for AOD, admitted for postpartum preeclampsia.  She reports she doing well. No acute events overnight. She denies any problems with ambulating, voiding or po intake. Denies nausea or vomiting, RUQ pain. Her headache is mild at this time and the first headache since admission. Pain is well controlled.  Lochia is appropriate.  Objective: Blood pressure 122/87, pulse 94, temperature 98.5 F (36.9 C), temperature source Oral, resp. rate 17, height 5\' 5"  (1.651 m), weight 96 kg, SpO2 99%, unknown if currently breastfeeding.  Physical Exam:  General: alert, cooperative and no distress. Family and baby at bedside Chest: CTAB, no rale, rhonchi, or wheezing Heart: regular rate and rhythm, distal pulses intact Abdomen: soft, mildly tender over incision site DVT Evaluation: No calf swelling or tenderness Extremities: No LE edema Skin: warm, dry; incision clean/dry/intact healing well, no signs of infection   Recent Labs    03/15/24 2341  HGB 9.9*  HCT 29.9*    Assessment/Plan: Gabriella Phillips is a 23 y.o. G1P1001 POD 11 here for postpartum preeclampsia.   She was started on procardia outpatient for elevated blood pressure readings and subsequently had headaches. She was switched to labetalol 400 mg BID but blood pressures were persistently elevated. She reported a HA on admission 2/10 pain but states her current headache feel milder and likely due to lack of sleep. She has not had vision changes or RUQ pain.   S/p mag bolus.  -Continue mag 24/h -IV BP protocol -Follow up mag level -Consider restarting labetalol following mag   Dispo: Plan for discharge 4/16 pending BP control.   LOS: 0 days   Gabriella Hilario Autry-Lott, DO 03/16/2024, 1:01 PM

## 2024-03-16 NOTE — H&P (Signed)
 Gabriella Phillips is an 23 y.o. female. S/P CS at 40 weeks for failed induction due to [facility].  She presents today with elevated blood pressures and mild headacche.  No RUQ pain no epigastric pain  Pertinent Gynecological History: Menses: flow is light Bleeding: PT is PP Contraception: abstinence DES exposure: denies Blood transfusions: none Sexually transmitted diseases: no past history Previous GYN Procedures:      Last mammogram:  NA  Date: NA Last pap: normal Date:  OB History: G1, P1   Menstrual History: Menarche age:  No LMP recorded.    Past Medical History:  Diagnosis Date   Macromastia    Medical history non-contributory     Past Surgical History:  Procedure Laterality Date   BREAST REDUCTION SURGERY Bilateral 05/12/2019   Procedure: MAMMARY REDUCTION  (BREAST);  Surgeon: Peggye Form, DO;  Location: [facility];  Service: Plastics;  Laterality: Bilateral;  Please adjust timeframe to 4 hours (240 min)   CESAREAN SECTION N/A 03/05/2024   Procedure: CESAREAN DELIVERY;  Surgeon: Lavonda Jumbo, DO;  Location: [facility];  Service: Obstetrics;  Laterality: N/A;   WISDOM TOOTH EXTRACTION      Family History  Problem Relation Age of Onset   Hypertension Mother     Social History:  reports that she has never smoked. She has never used smokeless tobacco. She reports that she does not drink alcohol and does not use drugs.  Allergies: No Known Allergies  Medications Prior to Admission  Medication Sig Dispense Refill Last Dose/Taking   ferrous sulfate 325 (65 FE) MG tablet Take 1 tablet (325 mg total) by mouth 2 (two) times daily with a meal. 60 tablet 3    ibuprofen (ADVIL) 600 MG tablet Take 1 tablet (600 mg total) by mouth every 6 (six) hours. 30 tablet 1    NIFEdipine (PROCARDIA XL) 30 MG 24 hr tablet Take 1 tablet (30 mg total) by mouth daily. 30 tablet 3    oxyCODONE (OXY IR/ROXICODONE) 5 MG immediate release tablet Take 1-2 tablets (5-10 mg  total) by mouth every 6 (six) hours as needed for breakthrough pain. 30 tablet 0    Prenatal Vit-Fe Fumarate-FA (MULTIVITAMIN-PRENATAL) 27-0.8 MG TABS tablet Take 1 tablet by mouth daily at 12 noon.       Review of Systems  Blood pressure 130/84, pulse 91, temperature 97.9 F (36.6 C), temperature source Oral, resp. rate 18, height 5\' 5"  (1.651 m), weight 96 kg, SpO2 100%, unknown if currently breastfeeding. Physical Exam EXAM DONE BY APP IN MAU  Results for orders placed or performed during the hospital encounter of 03/15/24 (from the past 24 hours)  CBC     Status: Abnormal   Collection Time: 03/15/24 11:41 PM  Result Value Ref Range   WBC 9.2 4.0 - 10.5 K/uL   RBC 3.37 (L) 3.87 - 5.11 MIL/uL   Hemoglobin 9.9 (L) 12.0 - 15.0 g/dL   HCT 16.1 (L) 09.6 - 04.5 %   MCV 88.7 80.0 - 100.0 fL   MCH 29.4 26.0 - 34.0 pg   MCHC 33.1 30.0 - 36.0 g/dL   RDW 40.9 81.1 - 91.4 %   Platelets 381 150 - 400 K/uL   nRBC 0.0 0.0 - 0.2 %  Comprehensive metabolic panel with GFR     Status: Abnormal   Collection Time: 03/15/24 11:41 PM  Result Value Ref Range   Sodium 137 135 - 145 mmol/L   Potassium 3.7 3.5 - 5.1 mmol/L  Chloride 107 98 - 111 mmol/L   CO2 23 22 - 32 mmol/L   Glucose, Bld 97 70 - 99 mg/dL   BUN 11 6 - 20 mg/dL   Creatinine, Ser 6.04 0.44 - 1.00 mg/dL   Calcium 9.1 8.9 - 54.0 mg/dL   Total Protein 6.4 (L) 6.5 - 8.1 g/dL   Albumin 3.0 (L) 3.5 - 5.0 g/dL   AST 18 15 - 41 U/L   ALT 16 0 - 44 U/L   Alkaline Phosphatase 85 38 - 126 U/L   Total Bilirubin 0.4 0.0 - 1.2 mg/dL   GFR, Estimated >98 >11 mL/min   Anion gap 7 5 - 15    No results found.  Assessment/Plan: PP PE WITH severe features Pt was taking labetalol and still has SBP in 180s Procaredia gave her a HA Labs WNL Start magnesium sulfate for seizure prophylaxsis Strict I/os Will alert [place] This is a late entry  Izik Bingman A Rex Oesterle 03/16/2024, 5:15 PM

## 2024-03-16 NOTE — MAU Provider Note (Signed)
 History     CSN: [id number]  Arrival date and time: 03/15/24 2254   Event Date/Time   First Provider Initiated Contact with Patient 03/16/24 0021      Chief Complaint  Patient presents with   Hypertension   Headache   HPI Gabriella Phillips is a 23 y.o. year old G34P1001 female who is 11 days postpartum; s/p C/S who presents to MAU reporting she was induced on 03/05/2024 for elevated BPs. She ws not on any medications for HTN before or after delivery. She reports she had a "bad" H/A 03/11/2024 and BP was 160/116 at her BP check appt. She was Rx'd Procardia on 03/12/2024, but that was changed to Labetalol BID on 03/12/2024 d/t H/As; last dose of Labetalol was 2030 on 4/12/06/2023. She reports she took her BP in the car while in the hospital parking lot; it was 163/115 (using a wrist cuff). She reports a H/A; rated 2/10. She has not taken any medication for the H/A. She denies any visual changes or epigastric pain. She receives [facility] with [facility].   OB History as of 03/05/2024     Gravida  1   Para  1   Term  1   Preterm      AB      Living  1      SAB      IAB      Ectopic      Multiple  0   Live Births  1           Past Medical History:  Diagnosis Date   Macromastia    Medical history non-contributory     Past Surgical History:  Procedure Laterality Date   BREAST REDUCTION SURGERY Bilateral 05/12/2019   Procedure: MAMMARY REDUCTION  (BREAST);  Surgeon: Thornell Flirt, DO;  Location: [facility];  Service: Plastics;  Laterality: Bilateral;  Please adjust timeframe to 4 hours (240 min)   CESAREAN SECTION N/A 03/05/2024   Procedure: CESAREAN DELIVERY;  Surgeon: Thana Filter, DO;  Location: [facility];  Service: Obstetrics;  Laterality: N/A;   WISDOM TOOTH EXTRACTION      Family History  Problem Relation Age of Onset   Hypertension Mother     Social History   Tobacco Use   Smoking status: Never   Smokeless tobacco: Never  Vaping  Use   Vaping status: Never Used  Substance Use Topics   Alcohol use: Never   Drug use: Never    Allergies: No Known Allergies  Medications Prior to Admission  Medication Sig Dispense Refill Last Dose/Taking   ferrous sulfate 325 (65 FE) MG tablet Take 1 tablet (325 mg total) by mouth 2 (two) times daily with a meal. 60 tablet 3    ibuprofen (ADVIL) 600 MG tablet Take 1 tablet (600 mg total) by mouth every 6 (six) hours. 30 tablet 1    NIFEdipine (PROCARDIA XL) 30 MG 24 hr tablet Take 1 tablet (30 mg total) by mouth daily. 30 tablet 3    oxyCODONE (OXY IR/ROXICODONE) 5 MG immediate release tablet Take 1-2 tablets (5-10 mg total) by mouth every 6 (six) hours as needed for breakthrough pain. 30 tablet 0    Prenatal Vit-Fe Fumarate-FA (MULTIVITAMIN-PRENATAL) 27-0.8 MG TABS tablet Take 1 tablet by mouth daily at 12 noon.       Review of Systems  Constitutional: Negative.   HENT: Negative.    Eyes: Negative.   Respiratory: Negative.    Cardiovascular:  Negative for chest pain and leg swelling.       Elevated BPs  Gastrointestinal: Negative.   Endocrine: Negative.   Genitourinary: Negative.   Musculoskeletal: Negative.   Skin: Negative.   Allergic/Immunologic: Negative.   Neurological:  Positive for headaches (was 2/10; now 4/10).  Hematological: Negative.   Psychiatric/Behavioral: Negative.     Physical Exam  Patient Vitals for the past 24 hrs:  BP Temp Temp src Pulse Resp Height Weight  03/16/24 0201 (!) 150/91 -- -- 91 -- -- --  03/16/24 0146 (!) 152/94 -- -- 89 -- -- --  03/16/24 0131 (!) 151/98 -- -- 91 -- -- --  03/16/24 0121 (!) 146/96 -- -- 89 -- -- --  03/16/24 0108 (!) 185/112 -- -- -- -- -- --  03/16/24 0046 (!) 180/106 -- -- 77 -- -- --  03/16/24 0040 (!) 181/114 -- -- -- -- -- --  03/16/24 0016 (!) 171/104 -- -- 75 -- -- --  03/16/24 0000 (!) 155/99 -- -- 75 -- -- --  03/15/24 2353 (!) 163/96 -- -- 75 -- -- --  03/15/24 2352 (!) 159/98 -- -- 71 -- -- --   03/15/24 2327 (!) 152/100 98.4 F (36.9 C) Oral 81 14 5\' 5"  (1.651 m) 96 kg     Physical Exam Vitals and nursing note reviewed.  Constitutional:      Appearance: Normal appearance. She is normal weight.  Cardiovascular:     Rate and Rhythm: Normal rate.  Pulmonary:     Effort: Pulmonary effort is normal.  Abdominal:     Palpations: Abdomen is soft.  Genitourinary:    Comments: deferred Musculoskeletal:     Right lower leg: No edema.     Left lower leg: No edema.  Skin:    General: Skin is warm and dry.  Neurological:     Mental Status: She is alert and oriented to person, place, and time.  Psychiatric:        Mood and Affect: Mood normal.        Behavior: Behavior normal.        Thought Content: Thought content normal.        Judgment: Judgment normal.    Reassessment @ 0036: H/A is a little worse; rated 4/10 (up from 2/10). H/A located over LT eye and LT frontal portion of head. MAU Course  Procedures  MDM CCUA CBC CMP Serial BP's   Results for orders placed or performed during the hospital encounter of 03/15/24 (from the past 24 hours)  CBC     Status: Abnormal   Collection Time: 03/15/24 11:41 PM  Result Value Ref Range   WBC 9.2 4.0 - 10.5 K/uL   RBC 3.37 (L) 3.87 - 5.11 MIL/uL   Hemoglobin 9.9 (L) 12.0 - 15.0 g/dL   HCT 16.1 (L) 09.6 - 04.5 %   MCV 88.7 80.0 - 100.0 fL   MCH 29.4 26.0 - 34.0 pg   MCHC 33.1 30.0 - 36.0 g/dL   RDW 40.9 81.1 - 91.4 %   Platelets 381 150 - 400 K/uL   nRBC 0.0 0.0 - 0.2 %  Comprehensive metabolic panel with GFR     Status: Abnormal   Collection Time: 03/15/24 11:41 PM  Result Value Ref Range   Sodium 137 135 - 145 mmol/L   Potassium 3.7 3.5 - 5.1 mmol/L   Chloride 107 98 - 111 mmol/L   CO2 23 22 - 32 mmol/L   Glucose, Bld 97 70 -  99 mg/dL   BUN 11 6 - 20 mg/dL   Creatinine, Ser 1.61 0.44 - 1.00 mg/dL   Calcium 9.1 8.9 - 09.6 mg/dL   Total Protein 6.4 (L) 6.5 - 8.1 g/dL   Albumin 3.0 (L) 3.5 - 5.0 g/dL   AST 18 15 -  41 U/L   ALT 16 0 - 44 U/L   Alkaline Phosphatase 85 38 - 126 U/L   Total Bilirubin 0.4 0.0 - 1.2 mg/dL   GFR, Estimated >04 >54 mL/min   Anion gap 7 5 - 15     *Consult with Dr. Willey Harrier @ [phone] - notified of patient's complaints, assessments, lab results, tx plan call provider for PP admission and MgSO4 orders - agrees with plan  TC to Dr. Mills Alma @ 0149 - notified of patient's complaints, assessments, lab results; agrees with plan. Orders received to admit to OBSCU, MgSO4 4 gm loading dose then 2 gm/hr and regular diet. Dr. Mills Alma will enter the rest of orders.  Assessment and Plan  1. Preeclampsia in postpartum period (Primary) - Admit to OBSCU - Magnesium Sulfate 4 gm loading dose then 2 gm/hr - Dr. Mills Alma to assume care of patient at the time of admission  Almond Army, CNM 03/16/2024, 12:22 AM

## 2024-03-17 ENCOUNTER — Other Ambulatory Visit (HOSPITAL_COMMUNITY): Payer: Self-pay

## 2024-03-17 MED ORDER — LABETALOL HCL 200 MG PO TABS: 400.0000 mg | ORAL_TABLET | Freq: Two times a day (BID) | ORAL | Status: DC

## 2024-03-17 MED ORDER — LABETALOL HCL 200 MG PO TABS
400.0000 mg | ORAL_TABLET | Freq: Two times a day (BID) | ORAL | 1 refills | Status: DC
  Filled 2024-03-17: qty 120, 30d supply, fill #0

## 2024-03-17 MED ORDER — LABETALOL HCL 200 MG PO TABS
200.0000 mg | ORAL_TABLET | Freq: Two times a day (BID) | ORAL | Status: DC
  Administered 2024-03-17: 200 mg via ORAL
  Filled 2024-03-17: qty 1

## 2024-03-17 MED ORDER — LABETALOL HCL 200 MG PO TABS
400.0000 mg | ORAL_TABLET | Freq: Three times a day (TID) | ORAL | Status: DC
  Administered 2024-03-17 – 2024-03-18 (×3): 400 mg via ORAL
  Filled 2024-03-17 (×3): qty 2

## 2024-03-17 MED ORDER — LABETALOL HCL 200 MG PO TABS
200.0000 mg | ORAL_TABLET | Freq: Once | ORAL | Status: AC
  Administered 2024-03-17: 200 mg via ORAL
  Filled 2024-03-17: qty 1

## 2024-03-17 NOTE — Discharge Summary (Signed)
 Physician Discharge Summary  Patient ID: Gabriella Phillips MRN: [id number] DOB/AGE: [birth year] 22 y.o.  Admit date: 03/15/2024 Discharge date: 03/17/2024  Admission Diagnoses: preeclampsia with severe features in the postpartum period   Discharge Diagnoses:  Principal Problem:   Preeclampsia in postpartum period   Discharged Condition: stable  Hospital Course: Gabriella Phillips presented on POD #10 s/p primary cesarean section complaining of headache and elevated blood pressures. She had a previous diagnosis of gestational hypertension. She  was admitted on 03/16/2024 with severe range blood pressures 180/112  and mild headache despite taking labetalol 200 mg twice a day. She was started on magnesium sulfate for seizure prophylaxis . This was discontinued after 24 hours of therapy. Her blood pressures stabilized while on the magnesium sulfate. She was started on labetalol 400 mg bid for bp management and is discharged home on this medication with close follow up in the office within 24 hours for blood pressure check   Consults: None  Significant Diagnostic Studies: labs:  Results for orders placed or performed during the hospital encounter of 03/15/24 (from the past 48 hours)  CBC     Status: Abnormal   Collection Time: 03/15/24 11:41 PM  Result Value Ref Range   WBC 9.2 4.0 - 10.5 K/uL   RBC 3.37 (L) 3.87 - 5.11 MIL/uL   Hemoglobin 9.9 (L) 12.0 - 15.0 g/dL   HCT 40.9 (L) 81.1 - 91.4 %   MCV 88.7 80.0 - 100.0 fL   MCH 29.4 26.0 - 34.0 pg   MCHC 33.1 30.0 - 36.0 g/dL   RDW 78.2 95.6 - 21.3 %   Platelets 381 150 - 400 K/uL   nRBC 0.0 0.0 - 0.2 %    Comment: Performed at [facility], [address], [place], [place] [address]  Comprehensive metabolic panel with GFR     Status: Abnormal   Collection Time: 03/15/24 11:41 PM  Result Value Ref Range   Sodium 137 135 - 145 mmol/L   Potassium 3.7 3.5 - 5.1 mmol/L   Chloride 107 98 - 111 mmol/L   CO2 23 22 - 32 mmol/L   Glucose, Bld 97  70 - 99 mg/dL    Comment: Glucose reference range applies only to samples taken after fasting for at least 8 hours.   BUN 11 6 - 20 mg/dL   Creatinine, Ser 8.46 0.44 - 1.00 mg/dL   Calcium 9.1 8.9 - 96.2 mg/dL   Total Protein 6.4 (L) 6.5 - 8.1 g/dL   Albumin 3.0 (L) 3.5 - 5.0 g/dL   AST 18 15 - 41 U/L   ALT 16 0 - 44 U/L   Alkaline Phosphatase 85 38 - 126 U/L   Total Bilirubin 0.4 0.0 - 1.2 mg/dL   GFR, Estimated >95 >28 mL/min    Comment: (NOTE) Calculated using the CKD-EPI Creatinine Equation (2021)    Anion gap 7 5 - 15    Comment: Performed at [facility], [address] [place], [place] [address]  Magnesium     Status: Abnormal   Collection Time: 03/16/24  5:43 PM  Result Value Ref Range   Magnesium 5.4 (H) 1.7 - 2.4 mg/dL    Comment: Performed at [facility], [address], [place], [place] [address]     Treatments: Magnesium sulfate and IV antihypertensives   Discharge Exam: Blood pressure (!) 148/87, pulse 83, temperature 98.7 F (37.1 C), temperature source Oral, resp. rate 18, height 5\' 5"  (1.651 m),  weight 96 kg, SpO2 99%, unknown if currently breastfeeding. General appearance: alert, cooperative, and no distress Resp: no distress  GI: soft, non-tender; bowel sounds normal; no masses,  no organomegaly Extremities: extremities normal, atraumatic, no cyanosis or edema Incision/Wound: well approximated no erythema or exudate   Disposition:  Discharge disposition: 01-Home or Self Care       Discharge Instructions     Activity as tolerated   Complete by: As directed    Call MD for:  difficulty breathing, headache or visual disturbances   Complete by: As directed    Call MD for:  persistant nausea and vomiting   Complete by: As directed    Call MD for:  redness, tenderness, or signs of infection (pain, swelling, redness, odor or green/yellow discharge around incision site)   Complete by: As directed    Call MD for:  severe uncontrolled pain    Complete by: As directed    Call MD for:  temperature >100.4   Complete by: As directed    Diet - low sodium heart healthy   Complete by: As directed    Lifting restrictions   Complete by: As directed    Weight restriction of 10 lbs.   No wound care   Complete by: As directed    Sexual activity   Complete by: As directed    Avoid sex for 6 weeks from the date of cesarean section      Allergies as of 03/17/2024   No Known Allergies      Medication List     STOP taking these medications    NIFEdipine 30 MG 24 hr tablet Commonly known as: Procardia XL       TAKE these medications    ferrous sulfate 325 (65 FE) MG tablet Take 1 tablet (325 mg total) by mouth 2 (two) times daily with a meal.   ibuprofen 600 MG tablet Commonly known as: ADVIL Take 1 tablet (600 mg total) by mouth every 6 (six) hours.   Labetalol HCl 400 MG Tabs Take 400 mg by mouth 2 (two) times daily.   multivitamin-prenatal 27-0.8 MG Tabs tablet Take 1 tablet by mouth daily at 12 noon.   oxyCODONE 5 MG immediate release tablet Commonly known as: Oxy IR/ROXICODONE Take 1-2 tablets (5-10 mg total) by mouth every 6 (six) hours as needed for breakthrough pain.        Follow-up Information     Arlee Lace, MD. Go in 1 day(s).   Specialty: Obstetrics and Gynecology Why: please call the office to make an appointment for blood pressure check 03/18/2024 Contact information: [address] [place] [address] [phone]                 Signed: Arlee Lace 03/17/2024, 3:42 PM

## 2024-03-18 MED ORDER — LABETALOL HCL 200 MG PO TABS
200.0000 mg | ORAL_TABLET | Freq: Once | ORAL | Status: AC
  Administered 2024-03-18: 200 mg via ORAL
  Filled 2024-03-18: qty 1

## 2024-03-18 MED ORDER — LABETALOL HCL 200 MG PO TABS
600.0000 mg | ORAL_TABLET | Freq: Three times a day (TID) | ORAL | Status: DC
  Administered 2024-03-18 – 2024-03-19 (×3): 600 mg via ORAL
  Filled 2024-03-18 (×3): qty 3

## 2024-03-18 MED ORDER — LABETALOL HCL 200 MG PO TABS
600.0000 mg | ORAL_TABLET | Freq: Three times a day (TID) | ORAL | Status: DC
  Administered 2024-03-18: 600 mg via ORAL
  Filled 2024-03-18: qty 3

## 2024-03-18 MED ORDER — AMLODIPINE BESYLATE 5 MG PO TABS
5.0000 mg | ORAL_TABLET | Freq: Every day | ORAL | Status: DC
  Administered 2024-03-18 – 2024-03-19 (×2): 5 mg via ORAL
  Filled 2024-03-18 (×2): qty 1

## 2024-03-18 NOTE — Progress Notes (Signed)
 POD #13 HD #3 readmitted with postpartum preeclampsia with severe features.   Subjective: pt denies headache visual disturbances or ruq pain.   Vitals:   03/18/24 0758 03/18/24 1242 03/18/24 1536 03/18/24 1537  BP: (!) 145/92 (!) 156/102 (!) 150/91 (!) 150/91  Pulse: 78 67  73  Resp: 17 16  17   Temp: 98.6 F (37 C) 98.7 F (37.1 C)  98.9 F (37.2 C)  TempSrc: Oral Oral  Oral  SpO2: 100% 98%  99%  Weight:      Height:        General Alert and oriented NAD Resp: no distress  Abdomen soft nontender nondistended  Incision: well approximated no erythema or exudate  Ext: no edema   A/P Postpartum preclampsia with severe features  - s/p 24 hours magnesium sulfate  -continue labetalol 600 mg tid .Aaron Aas Will add norvasc 5 mg daily given continued bp greater than 150/100.  - Possible discharge home tomorrow.  - [facility] covering 03/19/2024 at 7 am.

## 2024-03-19 MED ORDER — LABETALOL HCL 300 MG PO TABS
600.0000 mg | ORAL_TABLET | Freq: Three times a day (TID) | ORAL | 0 refills | Status: DC
  Filled 2024-03-19 – 2024-03-30 (×2): qty 180, 30d supply, fill #0

## 2024-03-19 MED ORDER — AMLODIPINE BESYLATE 5 MG PO TABS
5.0000 mg | ORAL_TABLET | Freq: Once | ORAL | Status: AC
  Administered 2024-03-19: 5 mg via ORAL
  Filled 2024-03-19: qty 1

## 2024-03-19 MED ORDER — AMLODIPINE BESYLATE 5 MG PO TABS: 10.0000 mg | ORAL_TABLET | Freq: Every day | ORAL | Status: DC

## 2024-03-19 MED ORDER — AMLODIPINE BESYLATE 10 MG PO TABS
10.0000 mg | ORAL_TABLET | Freq: Every day | ORAL | 0 refills | Status: DC
  Filled 2024-03-19: qty 30, 30d supply, fill #0

## 2024-03-19 MED ORDER — AMLODIPINE BESYLATE 10 MG PO TABS: 10.0000 mg | ORAL_TABLET | Freq: Every day | ORAL | 0 refills | Status: DC

## 2024-03-19 NOTE — Discharge Summary (Signed)
 Discharge Summary  Date of Service updated 03/19/24    Patient Name: Gabriella Phillips DOB: 2001-02-13 MRN: [id number]  Date of admission: 03/15/2024 Date of discharge: 03/19/2024  Admitting diagnosis: Preeclampsia in postpartum period [O14.95]    Discharge diagnosis: Preeclampsia (severe)                                              Hospital course: Presented to [facility] on post-op day 11 after a failed induction of labor for gestational hypertension. Pt was treated with magnesium  sulfate and oral labetalol  and discharged home with pre-eclampsia precautions. While home, symptoms progressed. Returned with a chief complaint of headache and elevated blood pressure.  Treated with magnesium  sulfate, IV, and PO antihypertensives.  Moderate BP values achieved with adding amlodipine  and increasing labetalol .     Physical exam  Vitals:   03/19/24 1534 03/19/24 1557 03/19/24 1739 03/19/24 1837  BP: (!) 168/105 (!) 149/103 124/86 133/89  Pulse: 85 77 92 93  Resp: 19     Temp: 98 F (36.7 C)     TempSrc: Oral     SpO2: 100%     Weight:      Height:       General: alert, cooperative, and no distress Resp: unlabored, clear Cardiac: RRR, no edema Neuro: 2+ DTR, neg clonus DVT Evaluation: No evidence of DVT seen on physical exam. No cords or calf tenderness. No significant calf/ankle edema. Labs: Lab Results  Component Value Date   WBC 9.2 03/15/2024   HGB 9.9 (L) 03/15/2024   HCT 29.9 (L) 03/15/2024   MCV 88.7 03/15/2024   PLT 381 03/15/2024      Latest Ref Rng & Units 03/15/2024   11:41 PM  CMP  Glucose 70 - 99 mg/dL 97   BUN 6 - 20 mg/dL 11   Creatinine 9.55 - 1.00 mg/dL 9.10   Sodium 864 - 854 mmol/L 137   Potassium 3.5 - 5.1 mmol/L 3.7   Chloride 98 - 111 mmol/L 107   CO2 22 - 32 mmol/L 23   Calcium 8.9 - 10.3 mg/dL 9.1   Total Protein 6.5 - 8.1 g/dL 6.4   Total Bilirubin 0.0 - 1.2 mg/dL 0.4   Alkaline Phos 38 - 126 U/L 85   AST 15 - 41 U/L 18   ALT 0 - 44 U/L 16     Edinburgh Score:     After visit meds:  Allergies as of 03/19/2024   No Known Allergies      Medication List     STOP taking these medications    NIFEdipine  30 MG 24 hr tablet Commonly known as: Procardia  XL       TAKE these medications    amLODipine  10 MG tablet Commonly known as: NORVASC  Take 1 tablet (10 mg total) by mouth daily. Start taking on: March 20, 2024   ferrous sulfate  325 (65 FE) MG tablet Take 1 tablet (325 mg total) by mouth 2 (two) times daily with a meal.   ibuprofen  600 MG tablet Commonly known as: ADVIL  Take 1 tablet (600 mg total) by mouth every 6 (six) hours.   labetalol  300 MG tablet Commonly known as: NORMODYNE  Take 2 tablets (600 mg total) by mouth every 8 (eight) hours. Start taking on: March 20, 2024   multivitamin-prenatal 27-0.8 MG Tabs tablet Take 1 tablet by mouth  daily at 12 noon.   oxyCODONE  5 MG immediate release tablet Commonly known as: Oxy IR/ROXICODONE  Take 1-2 tablets (5-10 mg total) by mouth every 6 (six) hours as needed for breakthrough pain.         Discharge home in stable condition Discharge instruction: postpartum pre-eclampsia precautions Activity: Advance as tolerated. Pelvic rest for 6 weeks.  Diet: low salt diet Additional Postpartum F/U: BP check 2-3 days Future Appointments:No future appointments. Follow up Visit:  Follow-up Information     Rosalva Sawyer, MD. Go in 1 day(s).   Specialty: Obstetrics and Gynecology Why: please call the office to make an appointment for blood pressure check 03/18/2024 Contact information: [address] [place] [address] [phone]                     03/19/2024 Mercer KATHEE Peal, CNM

## 2024-03-19 NOTE — Progress Notes (Signed)
 Subjective Reports feeling well, denies HA, visual changes, and RUQ pain Reports tolerating PO and denies N/V, foley removed, ambulating and urinating w/o difficulty Caring for self independently Vaginal bleeding is minimal, without clots     Objective:  VS:  Vitals:   03/19/24 0028 03/19/24 0418 03/19/24 0813 03/19/24 0832  BP: (!) 144/100 (!) 132/91 (!) 150/102 135/84  Pulse: 83 85 83 73  Resp: 18 17    Temp: 98.1 F (36.7 C) 98.5 F (36.9 C) 98.6 F (37 C)   TempSrc: Oral Oral Oral   SpO2: 98% 100%    Weight:      Height:          Physical Exam:  General: alert, cooperative, and no distress CV: Regular rate and rhythm Resp: clear Abdomen: soft, nontender Incision:  healed   Uterine no longer palpable Lochia:  scant Ext: no edema, negative for tenderness, pain, and cords   Assessment/Plan: 23 y.o.   POD# 14. G1P1001                  Principal Problem:   Preeclampsia in postpartum period POD# 14 S/P primary LTCS for arrest of dilation Hx GHTN Readmitted and treated for PP pre-eclampsia w/ severe features via severe range BP without proteinuria and normal labs BP managed on Labetalol  600 mg TID and amlodipine  5 mg daily Anticipate D/C later today if BP remains in moderate range no neuro symptoms F/U with [facility] within 1 week for BP check Detailed discussion on reportable symptoms  Gabriella KATHEE Peal, DNP, CNM 03/19/2024, 9:14 AM

## 2024-03-20 ENCOUNTER — Other Ambulatory Visit (HOSPITAL_COMMUNITY): Payer: Self-pay

## 2024-03-30 ENCOUNTER — Other Ambulatory Visit (HOSPITAL_COMMUNITY): Payer: Self-pay

## 2024-03-30 ENCOUNTER — Other Ambulatory Visit: Payer: Self-pay

## 2024-04-15 ENCOUNTER — Other Ambulatory Visit: Payer: Self-pay | Admitting: Family Medicine

## 2024-04-15 ENCOUNTER — Other Ambulatory Visit (HOSPITAL_COMMUNITY): Payer: Self-pay

## 2024-04-15 MED ORDER — AMLODIPINE BESYLATE 10 MG PO TABS
10.0000 mg | ORAL_TABLET | Freq: Every day | ORAL | 0 refills | Status: DC
  Filled 2024-04-15: qty 30, 30d supply, fill #0

## 2024-04-15 NOTE — Telephone Encounter (Signed)
 Looks like it was started at the hospital, ok to refill?

## 2024-04-23 DIAGNOSIS — Z98891 History of uterine scar from previous surgery: Secondary | ICD-10-CM | POA: Diagnosis not present

## 2024-05-09 ENCOUNTER — Other Ambulatory Visit (HOSPITAL_COMMUNITY): Payer: Self-pay

## 2024-05-13 ENCOUNTER — Other Ambulatory Visit (HOSPITAL_COMMUNITY): Payer: Self-pay

## 2024-05-18 ENCOUNTER — Other Ambulatory Visit (HOSPITAL_COMMUNITY): Payer: Self-pay

## 2024-05-20 ENCOUNTER — Other Ambulatory Visit (HOSPITAL_COMMUNITY): Payer: Self-pay

## 2024-05-21 ENCOUNTER — Other Ambulatory Visit (HOSPITAL_COMMUNITY): Payer: Self-pay

## 2024-05-22 ENCOUNTER — Other Ambulatory Visit (HOSPITAL_COMMUNITY): Payer: Self-pay

## 2024-06-01 ENCOUNTER — Other Ambulatory Visit (HOSPITAL_COMMUNITY): Payer: Self-pay

## 2024-06-01 MED ORDER — NORETHINDRONE ACET-ETHINYL EST 1-20 MG-MCG PO TABS
1.0000 | ORAL_TABLET | Freq: Every day | ORAL | 2 refills | Status: AC
  Filled 2024-06-01: qty 63, 63d supply, fill #0

## 2024-06-02 ENCOUNTER — Ambulatory Visit: Admitting: Family Medicine

## 2024-06-02 ENCOUNTER — Other Ambulatory Visit (HOSPITAL_COMMUNITY): Payer: Self-pay

## 2024-06-02 ENCOUNTER — Encounter: Payer: Self-pay | Admitting: Family Medicine

## 2024-06-02 VITALS — BP 108/76 | HR 75 | Temp 97.8°F | Ht 65.0 in | Wt 199.0 lb

## 2024-06-02 DIAGNOSIS — M25512 Pain in left shoulder: Secondary | ICD-10-CM

## 2024-06-02 MED ORDER — MELOXICAM 15 MG PO TABS
15.0000 mg | ORAL_TABLET | Freq: Every day | ORAL | 0 refills | Status: DC
  Filled 2024-06-02: qty 30, 30d supply, fill #0

## 2024-06-02 NOTE — Progress Notes (Signed)
 Subjective:     Patient ID: Gabriella Phillips, female    DOB: May 09, 2001, 23 y.o.   MRN: [id number]  Chief Complaint  Patient presents with   Shoulder Pain    Left shoulder pain since last week of April, pain when lifting feels like it is being pulled. Constant.     Shoulder Pain  Pertinent negatives include no fever or tingling.     History of Present Illness         Here with c/o left shoulder pain x 2 1/2 months. C/o pulling sensation and mainly in the front. Tough to lift over 5 lbs. She has not taken anything for this. No numbness, tingling or weakness.   Denies injury. Started the week after she delivered her baby in April.   LMP: 05/19/2024  Starting OCPs  She is not breastfeeding.     Health Maintenance Due  Topic Date Due   COVID-19 Vaccine (2 - 2024-25 season) 08/03/2023    Past Medical History:  Diagnosis Date   Macromastia    Medical history non-contributory     Past Surgical History:  Procedure Laterality Date   BREAST REDUCTION SURGERY Bilateral 05/12/2019   Procedure: MAMMARY REDUCTION  (BREAST);  Surgeon: Lowery Estefana RAMAN, DO;  Location: [facility];  Service: Plastics;  Laterality: Bilateral;  Please adjust timeframe to 4 hours (240 min)   CESAREAN SECTION N/A 03/05/2024   Procedure: CESAREAN DELIVERY;  Surgeon: Letha Savant, DO;  Location: [facility];  Service: Obstetrics;  Laterality: N/A;   WISDOM TOOTH EXTRACTION      Family History  Problem Relation Age of Onset   Hypertension Mother     Social History   Socioeconomic History   Marital status: Single    Spouse name: Not on file   Number of children: Not on file   Years of education: Not on file   Highest education level: Not on file  Occupational History   Not on file  Tobacco Use   Smoking status: Never   Smokeless tobacco: Never  Vaping Use   Vaping status: Never Used  Substance and Sexual Activity   Alcohol use: Never   Drug use: Never   Sexual  activity: Yes    Birth control/protection: Condom, Pill  Other Topics Concern   Not on file  Social History Narrative   Not on file   Social Drivers of Health   Financial Resource Strain: Not on file  Food Insecurity: No Food Insecurity (03/16/2024)   Hunger Vital Sign    Worried About Running Out of Food in the Last Year: Never true    Ran Out of Food in the Last Year: Never true  Transportation Needs: No Transportation Needs (03/16/2024)   PRAPARE - Administrator, Civil Service (Medical): No    Lack of Transportation (Non-Medical): No  Physical Activity: Not on file  Stress: Not on file  Social Connections: Not on file  Intimate Partner Violence: Not At Risk (03/16/2024)   Humiliation, Afraid, Rape, and Kick questionnaire    Fear of Current or Ex-Partner: No    Emotionally Abused: No    Physically Abused: No    Sexually Abused: No    Outpatient Medications Prior to Visit  Medication Sig Dispense Refill   amLODipine  (NORVASC ) 10 MG tablet Take 1 tablet (10 mg total) by mouth daily. 30 tablet 0   amLODipine  (NORVASC ) 10 MG tablet Take 1 tablet (10 mg total) by mouth daily. 30  tablet 0   ferrous sulfate  325 (65 FE) MG tablet Take 1 tablet (325 mg total) by mouth 2 (two) times daily with a meal. 60 tablet 3   labetalol  (NORMODYNE ) 300 MG tablet Take 2 tablets (600 mg total) by mouth every 8 (eight) hours. 180 tablet 0   norethindrone-ethinyl estradiol (LOESTRIN 1/20, 21,) 1-20 MG-MCG tablet Take 1 tablet by mouth daily. 63 tablet 2   Prenatal Vit-Fe Fumarate-FA (MULTIVITAMIN-PRENATAL) 27-0.8 MG TABS tablet Take 1 tablet by mouth daily at 12 noon.     ibuprofen  (ADVIL ) 600 MG tablet Take 1 tablet (600 mg total) by mouth every 6 (six) hours. 30 tablet 1   oxyCODONE  (OXY IR/ROXICODONE ) 5 MG immediate release tablet Take 1-2 tablets (5-10 mg total) by mouth every 6 (six) hours as needed for breakthrough pain. 30 tablet 0   No facility-administered medications prior to  visit.    No Known Allergies  Review of Systems  Constitutional:  Negative for chills and fever.  Respiratory:  Negative for shortness of breath.   Cardiovascular:  Negative for chest pain, palpitations and leg swelling.  Gastrointestinal:  Negative for abdominal pain, constipation, diarrhea, nausea and vomiting.  Musculoskeletal:  Positive for joint pain. Negative for back pain, falls, myalgias and neck pain.  Neurological:  Negative for dizziness, tingling and focal weakness.  Psychiatric/Behavioral:  Negative for depression. The patient is not nervous/anxious.        Objective:    Physical Exam Constitutional:      General: She is not in acute distress.    Appearance: She is not ill-appearing.  HENT:     Mouth/Throat:     Mouth: Mucous membranes are moist.     Pharynx: Oropharynx is clear.  Eyes:     Extraocular Movements: Extraocular movements intact.     Conjunctiva/sclera: Conjunctivae normal.  Cardiovascular:     Rate and Rhythm: Normal rate.  Pulmonary:     Effort: Pulmonary effort is normal.  Musculoskeletal:     Right shoulder: Normal.     Left shoulder: No swelling, effusion, tenderness, bony tenderness or crepitus. Decreased range of motion. Normal strength. Normal pulse.     Left upper arm: Normal.     Cervical back: Normal range of motion and neck supple. No tenderness.     Comments: Negative Neers and Hawkins as well as empty can test. No weakness compared to right   Skin:    General: Skin is warm and dry.  Neurological:     General: No focal deficit present.     Mental Status: She is alert and oriented to person, place, and time.     Cranial Nerves: No cranial nerve deficit.     Sensory: No sensory deficit.     Motor: No weakness.     Coordination: Coordination normal.     Gait: Gait normal.  Psychiatric:        Mood and Affect: Mood normal.        Behavior: Behavior normal.        Thought Content: Thought content normal.      BP 108/76 (BP  Location: Left Arm, Patient Position: Sitting)   Pulse 75   Temp 97.8 F (36.6 C) (Temporal)   Ht 5' 5 (1.651 m)   Wt 199 lb (90.3 kg)   LMP 05/19/2024   SpO2 99%   Breastfeeding No   BMI 33.12 kg/m  Wt Readings from Last 3 Encounters:  06/02/24 199 lb (90.3 kg)  03/15/24 211 lb  9.6 oz (96 kg)  03/04/24 236 lb (107 kg)       Assessment & Plan:   Problem List Items Addressed This Visit   None Visit Diagnoses       Acute pain of left shoulder    -  Primary      Left shoulder pain since delivering her baby in April.  Issues with certain range of motion and lifting heavy items.  No red flag symptoms.  She is not breast-feeding.  No chance of pregnancy.  Start meloxicam daily.  Use topical analgesic.  Handout for shoulder exercises given.  She will follow-up in 2 weeks and let me know how she is doing.  Imaging not done due to no history of trauma.  Consider referral to physical therapy or sports medicine if not improving.  I have discontinued Gabriella Phillips's ibuprofen  and oxyCODONE . I am also having her start on meloxicam. Additionally, I am having her maintain her multivitamin-prenatal, ferrous sulfate , labetalol , amLODipine , amLODipine , and norethindrone-ethinyl estradiol.  Meds ordered this encounter  Medications   meloxicam (MOBIC) 15 MG tablet    Sig: Take 1 tablet (15 mg total) by mouth daily.    Dispense:  30 tablet    Refill:  0    Supervising Provider:   ROLLENE NORRIS A [[id number]]

## 2024-06-02 NOTE — Patient Instructions (Addendum)
 Please take the meloxicam once daily with food.   Do the shoulder exercises.   Try using a topical over the counter medication such as Icy hot if you have this at home.   Follow up with me via Mychart or in person in 2 weeks and let me know how you are doing.

## 2024-06-09 ENCOUNTER — Other Ambulatory Visit (HOSPITAL_COMMUNITY): Payer: Self-pay

## 2024-06-10 ENCOUNTER — Other Ambulatory Visit (HOSPITAL_COMMUNITY): Payer: Self-pay

## 2024-07-13 DIAGNOSIS — H5213 Myopia, bilateral: Secondary | ICD-10-CM | POA: Diagnosis not present

## 2024-11-18 ENCOUNTER — Ambulatory Visit: Admitting: Family Medicine

## 2024-11-18 ENCOUNTER — Ambulatory Visit: Payer: Self-pay | Admitting: Family Medicine

## 2024-11-18 VITALS — BP 112/72 | HR 69 | Temp 97.6°F | Ht 65.0 in | Wt 207.0 lb

## 2024-11-18 DIAGNOSIS — R7303 Prediabetes: Secondary | ICD-10-CM

## 2024-11-18 DIAGNOSIS — E7800 Pure hypercholesterolemia, unspecified: Secondary | ICD-10-CM

## 2024-11-18 DIAGNOSIS — Z0000 Encounter for general adult medical examination without abnormal findings: Secondary | ICD-10-CM

## 2024-11-18 DIAGNOSIS — D229 Melanocytic nevi, unspecified: Secondary | ICD-10-CM | POA: Diagnosis not present

## 2024-11-18 DIAGNOSIS — E669 Obesity, unspecified: Secondary | ICD-10-CM | POA: Diagnosis not present

## 2024-11-18 DIAGNOSIS — Z0001 Encounter for general adult medical examination with abnormal findings: Secondary | ICD-10-CM

## 2024-11-18 DIAGNOSIS — D649 Anemia, unspecified: Secondary | ICD-10-CM

## 2024-11-18 LAB — CBC WITH DIFFERENTIAL/PLATELET
Basophils Absolute: 0 K/uL (ref 0.0–0.1)
Basophils Relative: 0.2 % (ref 0.0–3.0)
Eosinophils Absolute: 0.1 K/uL (ref 0.0–0.7)
Eosinophils Relative: 0.6 % (ref 0.0–5.0)
HCT: 36.5 % (ref 36.0–46.0)
Hemoglobin: 12.1 g/dL (ref 12.0–15.0)
Lymphocytes Relative: 23.5 % (ref 12.0–46.0)
Lymphs Abs: 1.9 K/uL (ref 0.7–4.0)
MCHC: 33.1 g/dL (ref 30.0–36.0)
MCV: 85.1 fl (ref 78.0–100.0)
Monocytes Absolute: 0.7 K/uL (ref 0.1–1.0)
Monocytes Relative: 8.9 % (ref 3.0–12.0)
Neutro Abs: 5.3 K/uL (ref 1.4–7.7)
Neutrophils Relative %: 66.8 % (ref 43.0–77.0)
Platelets: 292 K/uL (ref 150.0–400.0)
RBC: 4.29 Mil/uL (ref 3.87–5.11)
RDW: 14.3 % (ref 11.5–15.5)
WBC: 8 K/uL (ref 4.0–10.5)

## 2024-11-18 LAB — COMPREHENSIVE METABOLIC PANEL WITH GFR
ALT: 10 U/L (ref 3–35)
AST: 13 U/L (ref 5–37)
Albumin: 4.1 g/dL (ref 3.5–5.2)
Alkaline Phosphatase: 85 U/L (ref 39–117)
BUN: 10 mg/dL (ref 6–23)
CO2: 27 meq/L (ref 19–32)
Calcium: 9.4 mg/dL (ref 8.4–10.5)
Chloride: 104 meq/L (ref 96–112)
Creatinine, Ser: 0.76 mg/dL (ref 0.40–1.20)
GFR: 108.93 mL/min (ref >60.00)
Glucose, Bld: 91 mg/dL (ref 70–99)
Potassium: 4.3 meq/L (ref 3.5–5.1)
Sodium: 137 meq/L (ref 135–145)
Total Bilirubin: 0.3 mg/dL (ref 0.2–1.2)
Total Protein: 7.5 g/dL (ref 6.0–8.3)

## 2024-11-18 LAB — LIPID PANEL
Cholesterol: 186 mg/dL (ref 28–200)
HDL: 36.8 mg/dL — ABNORMAL LOW (ref >39.00)
LDL Cholesterol: 125 mg/dL — ABNORMAL HIGH (ref 10–99)
NonHDL: 149.59
Total CHOL/HDL Ratio: 5
Triglycerides: 121 mg/dL (ref 10.0–149.0)
VLDL: 24.2 mg/dL (ref 0.0–40.0)

## 2024-11-18 LAB — FOLATE: Folate: 14.3 ng/mL (ref >5.9)

## 2024-11-18 LAB — T4, FREE: Free T4: 0.8 ng/dL (ref 0.60–1.60)

## 2024-11-18 LAB — MAGNESIUM: Magnesium: 2 mg/dL (ref 1.5–2.5)

## 2024-11-18 LAB — HEMOGLOBIN A1C: Hgb A1c MFr Bld: 5.8 % (ref 4.6–6.5)

## 2024-11-18 LAB — TSH: TSH: 1.61 u[IU]/mL (ref 0.35–5.50)

## 2024-11-18 LAB — FERRITIN: Ferritin: 22.6 ng/mL (ref 10.0–291.0)

## 2024-11-18 LAB — VITAMIN B12: Vitamin B-12: 294 pg/mL (ref 211–911)

## 2024-11-18 NOTE — Patient Instructions (Signed)
 I am referring you to [facility] dermatology and they will call you to schedule a visit  I will be in touch with your lab results and with recommendations  Preventive Care 25-25 Years Old, Female Preventive care refers to lifestyle choices and visits with your health care provider that can promote health and wellness. Preventive care visits are also called wellness exams. What can I expect for my preventive care visit? Counseling During your preventive care visit, your health care provider may ask about your: Medical history, including: Past medical problems. Family medical history. Pregnancy history. Current health, including: Menstrual cycle. Method of birth control. Emotional well-being. Home life and relationship well-being. Sexual activity and sexual health. Lifestyle, including: Alcohol, nicotine or tobacco, and drug use. Access to firearms. Diet, exercise, and sleep habits. Work and work astronomer. Sunscreen use. Safety issues such as seatbelt and bike helmet use. Physical exam Your health care provider may check your: Height and weight. These may be used to calculate your BMI (body mass index). BMI is a measurement that tells if you are at a healthy weight. Waist circumference. This measures the distance around your waistline. This measurement also tells if you are at a healthy weight and may help predict your risk of certain diseases, such as type 2 diabetes and high blood pressure. Heart rate and blood pressure. Body temperature. Skin for abnormal spots. What immunizations do I need?  Vaccines are usually given at various ages, according to a schedule. Your health care provider will recommend vaccines for you based on your age, medical history, and lifestyle or other factors, such as travel or where you work. What tests do I need? Screening Your health care provider may recommend screening tests for certain conditions. This may include: Pelvic exam and Pap test. Lipid  and cholesterol levels. Diabetes screening. This is done by checking your blood sugar (glucose) after you have not eaten for a while (fasting). Hepatitis B test. Hepatitis C test. HIV (human immunodeficiency virus) test. STI (sexually transmitted infection) testing, if you are at risk. BRCA-related cancer screening. This may be done if you have a family history of breast, ovarian, tubal, or peritoneal cancers. Talk with your health care provider about your test results, treatment options, and if necessary, the need for more tests. Follow these instructions at home: Eating and drinking  Eat a healthy diet that includes fresh fruits and vegetables, whole grains, lean protein, and low-fat dairy products. Take vitamin and mineral supplements as recommended by your health care provider. Do not drink alcohol if: Your health care provider tells you not to drink. You are pregnant, may be pregnant, or are planning to become pregnant. If you drink alcohol: Limit how much you have to 0-1 drink a day. Know how much alcohol is in your drink. In the [place] one drink equals one 12 oz bottle of beer (355 mL), one 5 oz glass of wine (148 mL), or one 1 oz glass of hard liquor (44 mL). Lifestyle Brush your teeth every morning and night with fluoride toothpaste. Floss one time each day. Exercise for at least 30 minutes 5 or more days each week. Do not use any products that contain nicotine or tobacco. These products include cigarettes, chewing tobacco, and vaping devices, such as e-cigarettes. If you need help quitting, ask your health care provider. Do not use drugs. If you are sexually active, practice safe sex. Use a condom or other form of protection to prevent STIs. If you do not wish to become pregnant,  use a form of birth control. If you plan to become pregnant, see your health care provider for a prepregnancy visit. Find healthy ways to manage stress, such as: Meditation, yoga, or listening to  music. Journaling. Talking to a trusted person. Spending time with friends and family. Minimize exposure to UV radiation to reduce your risk of skin cancer. Safety Always wear your seat belt while driving or riding in a vehicle. Do not drive: If you have been drinking alcohol. Do not ride with someone who has been drinking. If you have been using any mind-altering substances or drugs. While texting. When you are tired or distracted. Wear a helmet and other protective equipment during sports activities. If you have firearms in your house, make sure you follow all gun safety procedures. Seek help if you have been physically or sexually abused. What's next? Go to your health care provider once a year for an annual wellness visit. Ask your health care provider how often you should have your eyes and teeth checked. Stay up to date on all vaccines. This information is not intended to replace advice given to you by your health care provider. Make sure you discuss any questions you have with your health care provider. Document Revised: 05/16/2021 Document Reviewed: 05/16/2021 Elsevier Patient Education  2024 [employer].

## 2024-11-18 NOTE — Progress Notes (Signed)
 Complete physical exam  Patient: Gabriella Phillips   DOB: 2001/07/29   23 y.o. Female  MRN: [id number]  Subjective:    Chief Complaint  Patient presents with   Annual Exam   She is here for a complete physical exam.   Discussed the use of AI scribe software for clinical note transcription with the patient, who gave verbal consent to proceed.  History of Present Illness [name] is a 23 year old female who presents for her annual preventive healthcare visit.  Hypertension and postpartum management - Received intravenous magnesium  for elevated blood pressure following delivery of her child. - No current symptoms of hypertension.  Prediabetes and lipid abnormalities - Diagnosed with prediabetes. - Previously noted elevated LDL cholesterol. - No history of hyperglycemia during pregnancy.  Anemia - Mild anemia. - Takes iron supplements inconsistently.  Dermatologic findings - Facial mole has gradually enlarged since high school, currently approximately pea-sized. - No prior evaluation of the mole. - No other skin concerns.  Musculoskeletal symptoms - Prior shoulder issue has resolved and is currently asymptomatic.  Gynecologic and reproductive health - Menstrual cycles are regular and not heavy. - Currently taking oral contraceptive pills. - Not breastfeeding her 35-month-old child.  Genitourinary and gastrointestinal symptoms - No urinary symptoms. - Normal bowel movements.    Health Maintenance  Topic Date Due   COVID-19 Vaccine (2 - 2025-26 season) 12/04/2024*   Flu Shot  03/01/2025*   Pap Smear  08/05/2026   DTaP/Tdap/Td vaccine (9 - Td or Tdap) 01/08/2034   Pneumococcal Vaccine  Completed   Hepatitis B Vaccine  Completed   HPV Vaccine  Completed   Hepatitis C Screening  Completed   HIV Screening  Completed   Meningitis B Vaccine  Aged Out  *Topic was postponed. The date shown is not the original due date.     Depression screening:     11/18/2024    3:14 PM 06/02/2024   10:58 AM 03/27/2023   10:01 AM  Depression screen PHQ 2/9  Decreased Interest 0 0 0  Down, Depressed, Hopeless 0 0 0  PHQ - 2 Score 0 0 0   Anxiety Screening:     No data to display           Patient Care Team: Lendia Boby CROME, NP-C as PCP - General (Family Medicine)   Show/hide medication list[1]  Review of Systems  Constitutional:  Negative for chills, fever, malaise/fatigue and weight loss.  HENT:  Negative for congestion, ear pain, sinus pain and sore throat.   Eyes:  Negative for blurred vision, double vision and pain.  Respiratory:  Negative for cough, shortness of breath and wheezing.   Cardiovascular:  Negative for chest pain, palpitations and leg swelling.  Gastrointestinal:  Negative for abdominal pain, constipation, diarrhea, nausea and vomiting.  Genitourinary:  Negative for dysuria, frequency and urgency.  Musculoskeletal:  Negative for back pain, joint pain and myalgias.  Skin:  Negative for rash.  Neurological:  Negative for dizziness, tingling, focal weakness and headaches.  Psychiatric/Behavioral:  Negative for depression and suicidal ideas. The patient is not nervous/anxious.        Objective:    BP 112/72   Pulse 69   Temp 97.6 F (36.4 C) (Temporal)   Ht 5' 5 (1.651 m)   Wt 207 lb (93.9 kg)   SpO2 98%   Breastfeeding No   BMI 34.45 kg/m  BP Readings from Last 3 Encounters:  11/18/24 112/72  06/02/24 108/76  03/19/24 133/89   Wt Readings from Last 3 Encounters:  11/18/24 207 lb (93.9 kg)  06/02/24 199 lb (90.3 kg)  03/15/24 211 lb 9.6 oz (96 kg)    Physical Exam Constitutional:      General: She is not in acute distress.    Appearance: She is not ill-appearing.  HENT:     Right Ear: Tympanic membrane, ear canal and external ear normal.     Left Ear: Tympanic membrane, ear canal and external ear normal.     Nose: Nose normal.     Mouth/Throat:     Mouth: Mucous membranes are moist.     Pharynx:  Oropharynx is clear.  Eyes:     Extraocular Movements: Extraocular movements intact.     Conjunctiva/sclera: Conjunctivae normal.     Pupils: Pupils are equal, round, and reactive to light.  Neck:     Thyroid : No thyroid  mass, thyromegaly or thyroid  tenderness.  Cardiovascular:     Rate and Rhythm: Normal rate and regular rhythm.     Pulses: Normal pulses.     Heart sounds: Normal heart sounds.  Pulmonary:     Effort: Pulmonary effort is normal.     Breath sounds: Normal breath sounds.  Abdominal:     General: Bowel sounds are normal.     Palpations: Abdomen is soft.     Tenderness: There is no abdominal tenderness. There is no right CVA tenderness, left CVA tenderness, guarding or rebound.  Musculoskeletal:        General: Normal range of motion.     Cervical back: Normal range of motion and neck supple. No tenderness.     Right lower leg: No edema.     Left lower leg: No edema.  Lymphadenopathy:     Cervical: No cervical adenopathy.  Skin:    General: Skin is warm and dry.     Findings: Lesion present. No rash.     Comments: Dark brown nevus of chin (see pic)  Neurological:     General: No focal deficit present.     Mental Status: She is alert and oriented to person, place, and time.     Cranial Nerves: No cranial nerve deficit.     Sensory: No sensory deficit.     Motor: No weakness.     Gait: Gait normal.  Psychiatric:        Mood and Affect: Mood normal.        Behavior: Behavior normal.        Thought Content: Thought content normal.      Results for orders placed or performed in visit on 11/18/24  Magnesium   Result Value Ref Range   Magnesium  2.0 1.5 - 2.5 mg/dL  T4, free  Result Value Ref Range   Free T4 0.80 0.60 - 1.60 ng/dL  TSH  Result Value Ref Range   TSH 1.61 0.35 - 5.50 uIU/mL  Hemoglobin A1c  Result Value Ref Range   Hgb A1c MFr Bld 5.8 4.6 - 6.5 %  Lipid panel  Result Value Ref Range   Cholesterol 186 28 - 200 mg/dL   Triglycerides 878.9  10.0 - 149.0 mg/dL   HDL 63.19 (L) >60.99 mg/dL   VLDL 75.7 0.0 - 59.9 mg/dL   LDL Cholesterol 874 (H) 10 - 99 mg/dL   Total CHOL/HDL Ratio 5    NonHDL 149.59   Vitamin B12  Result Value Ref Range   Vitamin B-12 294 211 - 911 pg/mL  Folate  Result  Value Ref Range   Folate 14.3 >5.9 ng/mL  Ferritin  Result Value Ref Range   Ferritin 22.6 10.0 - 291.0 ng/mL  Comprehensive metabolic panel with GFR  Result Value Ref Range   Sodium 137 135 - 145 mEq/L   Potassium 4.3 3.5 - 5.1 mEq/L   Chloride 104 96 - 112 mEq/L   CO2 27 19 - 32 mEq/L   Glucose, Bld 91 70 - 99 mg/dL   BUN 10 6 - 23 mg/dL   Creatinine, Ser 9.23 0.40 - 1.20 mg/dL   Total Bilirubin 0.3 0.2 - 1.2 mg/dL   Alkaline Phosphatase 85 39 - 117 U/L   AST 13 5 - 37 U/L   ALT 10 3 - 35 U/L   Total Protein 7.5 6.0 - 8.3 g/dL   Albumin 4.1 3.5 - 5.2 g/dL   GFR 891.06 >39.99 mL/min   Calcium 9.4 8.4 - 10.5 mg/dL  CBC with Differential/Platelet  Result Value Ref Range   WBC 8.0 4.0 - 10.5 K/uL   RBC 4.29 3.87 - 5.11 Mil/uL   Hemoglobin 12.1 12.0 - 15.0 g/dL   HCT 63.4 63.9 - 53.9 %   MCV 85.1 78.0 - 100.0 fl   MCHC 33.1 30.0 - 36.0 g/dL   RDW 85.6 88.4 - 84.4 %   Platelets 292.0 150.0 - 400.0 K/uL   Neutrophils Relative % 66.8 43.0 - 77.0 %   Lymphocytes Relative 23.5 12.0 - 46.0 %   Monocytes Relative 8.9 3.0 - 12.0 %   Eosinophils Relative 0.6 0.0 - 5.0 %   Basophils Relative 0.2 0.0 - 3.0 %   Neutro Abs 5.3 1.4 - 7.7 K/uL   Lymphs Abs 1.9 0.7 - 4.0 K/uL   Monocytes Absolute 0.7 0.1 - 1.0 K/uL   Eosinophils Absolute 0.1 0.0 - 0.7 K/uL   Basophils Absolute 0.0 0.0 - 0.1 K/uL      Assessment & Plan:    Routine Health Maintenance and Physical Exam Problem List Items Addressed This Visit     Elevated LDL cholesterol level   Relevant Orders   Lipid panel (Completed)   Encounter for general adult medical examination with abnormal findings - Primary   Prediabetes   Relevant Orders   CBC with Differential/Platelet  (Completed)   Comprehensive metabolic panel with GFR (Completed)   Hemoglobin A1c (Completed)   TSH (Completed)   T4, free (Completed)   Other Visit Diagnoses       Obesity (BMI 30-39.9)       Relevant Orders   CBC with Differential/Platelet (Completed)   Comprehensive metabolic panel with GFR (Completed)   Lipid panel (Completed)     Mild anemia       Relevant Orders   CBC with Differential/Platelet (Completed)   Comprehensive metabolic panel with GFR (Completed)   Ferritin (Completed)   Folate (Completed)   Vitamin B12 (Completed)     Changing nevus       Relevant Orders   Ambulatory referral to Dermatology     Hypermagnesemia       Relevant Orders   Magnesium  (Completed)       Assessment and Plan Assessment & Plan Annual preventive healthcare visit She reports no current complaints and is up to date with her eye exam, OBGYN visit, and dental check-up. Experienced some weight gain, which is significant but not severe. No family history of diabetes and no blood sugar issues during pregnancy. - Perform physical examination - Order blood tests including iron, B12, folate, cholesterol,  A1c, thyroid , and magnesium  - Advise on dietary modifications to manage weight gain, focusing on reducing carbohydrate and calorie intake, especially late-night snacks  Suspicious skin lesion A mole on her face has been gradually increasing in size since high school, now approximately pea-sized. No other skin issues reported. - Refer to dermatology for evaluation of suspicious skin lesion   Pre-diabetes Pre-diabetes requires monitoring to prevent progression to diabetes. - Monitor A1c levels as part of blood tests - Advise on lifestyle modifications to prevent progression to diabetes  Anemia Mild anemia with inconsistent iron supplement intake. Her periods are regular and not particularly heavy. - Check iron levels as part of blood tests - Encourage consistent intake of iron  supplements  Hyperlipidemia Elevated LDL cholesterol requires monitoring. - Check cholesterol levels as part of blood tests     No follow-ups on file.     Boby Mackintosh, NP-C      [1]  Outpatient Medications Prior to Visit  Medication Sig   ferrous sulfate  325 (65 FE) MG tablet Take 1 tablet (325 mg total) by mouth 2 (two) times daily with a meal.   norethindrone -ethinyl estradiol  (LOESTRIN  1/20, 21,) 1-20 MG-MCG tablet Take 1 tablet by mouth daily.   [DISCONTINUED] amLODipine  (NORVASC ) 10 MG tablet Take 1 tablet (10 mg total) by mouth daily.   [DISCONTINUED] amLODipine  (NORVASC ) 10 MG tablet Take 1 tablet (10 mg total) by mouth daily.   [DISCONTINUED] labetalol  (NORMODYNE ) 300 MG tablet Take 2 tablets (600 mg total) by mouth every 8 (eight) hours.   [DISCONTINUED] meloxicam  (MOBIC ) 15 MG tablet Take 1 tablet (15 mg total) by mouth daily.   [DISCONTINUED] Prenatal Vit-Fe Fumarate-FA (MULTIVITAMIN-PRENATAL) 27-0.8 MG TABS tablet Take 1 tablet by mouth daily at 12 noon.   No facility-administered medications prior to visit.

## 2024-11-25 ENCOUNTER — Emergency Department (HOSPITAL_BASED_OUTPATIENT_CLINIC_OR_DEPARTMENT_OTHER)
Admission: EM | Admit: 2024-11-25 | Discharge: 2024-11-25 | Disposition: A | Discharge status: Home / Self Care | Attending: Emergency Medicine | Admitting: Emergency Medicine

## 2024-11-25 ENCOUNTER — Encounter (HOSPITAL_BASED_OUTPATIENT_CLINIC_OR_DEPARTMENT_OTHER): Payer: Self-pay | Admitting: Emergency Medicine

## 2024-11-25 ENCOUNTER — Other Ambulatory Visit: Payer: Self-pay

## 2024-11-25 DIAGNOSIS — M549 Dorsalgia, unspecified: Secondary | ICD-10-CM | POA: Diagnosis present

## 2024-11-25 DIAGNOSIS — N3000 Acute cystitis without hematuria: Secondary | ICD-10-CM | POA: Diagnosis not present

## 2024-11-25 DIAGNOSIS — M546 Pain in thoracic spine: Secondary | ICD-10-CM | POA: Insufficient documentation

## 2024-11-25 LAB — URINALYSIS, W/ REFLEX TO CULTURE (INFECTION SUSPECTED)
Bilirubin Urine: NEGATIVE
Glucose, UA: NEGATIVE mg/dL
Hgb urine dipstick: NEGATIVE
Ketones, ur: NEGATIVE mg/dL
Nitrite: NEGATIVE
Specific Gravity, Urine: 1.022 (ref 1.005–1.030)
pH: 6 (ref 5.0–8.0)

## 2024-11-25 LAB — PREGNANCY, URINE: Preg Test, Ur: NEGATIVE

## 2024-11-25 MED ORDER — CEPHALEXIN 500 MG PO CAPS: 500.0000 mg | ORAL_CAPSULE | Freq: Two times a day (BID) | ORAL | 0 refills | Status: AC

## 2024-11-25 MED ORDER — CEPHALEXIN 250 MG PO CAPS
500.0000 mg | ORAL_CAPSULE | Freq: Once | ORAL | Status: AC
  Administered 2024-11-25: 500 mg via ORAL
  Filled 2024-11-25: qty 2

## 2024-11-25 MED ORDER — METHOCARBAMOL 500 MG PO TABS: 500.0000 mg | ORAL_TABLET | Freq: Two times a day (BID) | ORAL | 0 refills | Status: AC

## 2024-11-25 MED ORDER — KETOROLAC TROMETHAMINE 30 MG/ML IJ SOLN
30.0000 mg | Freq: Once | INTRAMUSCULAR | Status: AC
  Administered 2024-11-25: 30 mg via INTRAMUSCULAR
  Filled 2024-11-25: qty 1

## 2024-11-25 MED ORDER — NAPROXEN 500 MG PO TABS: 500.0000 mg | ORAL_TABLET | Freq: Two times a day (BID) | ORAL | 0 refills | Status: AC

## 2024-11-25 MED ORDER — NAPROXEN 250 MG PO TABS: 500.0000 mg | ORAL_TABLET | Freq: Once | ORAL | Status: DC

## 2024-11-25 NOTE — ED Triage Notes (Signed)
 Pt c/o right sided back near ribs x 2 days, reports cough, denies SOB and injury

## 2024-11-25 NOTE — ED Provider Notes (Signed)
 "  [place] [facility] AT [facility]  Provider Note  CSN: [id number] Arrival date & time: 11/25/24 0540  History Chief Complaint  Patient presents with   Back Pain    Gabriella Phillips is a 23 y.o. female with no significant PMH here for 2 days of R sided back pain, worse with bending and moving. She has had some mild cough but pain not worse when coughing. No fever. She has not had any urinary symptoms but is specifically concerned about a kidney infection and would like to have UA checked. No concerns for pregnancy.    Home Medications Prior to Admission medications  Medication Sig Start Date End Date Taking? Authorizing Provider  cephALEXin  (KEFLEX ) 500 MG capsule Take 1 capsule (500 mg total) by mouth 2 (two) times daily for 7 days. 11/25/24 12/02/24 Yes Roselyn Carlin NOVAK, MD  methocarbamol  (ROBAXIN ) 500 MG tablet Take 1 tablet (500 mg total) by mouth 2 (two) times daily. 11/25/24  Yes Roselyn Carlin NOVAK, MD  naproxen  (NAPROSYN ) 500 MG tablet Take 1 tablet (500 mg total) by mouth 2 (two) times daily. 11/25/24  Yes Roselyn Carlin NOVAK, MD  ferrous sulfate  325 (65 FE) MG tablet Take 1 tablet (325 mg total) by mouth 2 (two) times daily with a meal. 03/07/24   Storm Setter, DO  norethindrone -ethinyl estradiol  (LOESTRIN  1/20, 21,) 1-20 MG-MCG tablet Take 1 tablet by mouth daily. 06/01/24        Allergies    Patient has no known allergies.   Review of Systems   Review of Systems Please see HPI for pertinent positives and negatives  Physical Exam BP 108/79 (BP Location: Right Arm)   Pulse 89   Temp 97.9 F (36.6 C) (Oral)   Resp 17   Ht 5' 5 (1.651 m)   Wt 93.9 kg   SpO2 100%   BMI 34.45 kg/m   Physical Exam Vitals and nursing note reviewed.  Constitutional:      Appearance: Normal appearance.  HENT:     Head: Normocephalic and atraumatic.     Nose: Nose normal.     Mouth/Throat:     Mouth: Mucous membranes are moist.  Eyes:     Extraocular  Movements: Extraocular movements intact.     Conjunctiva/sclera: Conjunctivae normal.  Cardiovascular:     Rate and Rhythm: Normal rate.  Pulmonary:     Effort: Pulmonary effort is normal.     Breath sounds: Normal breath sounds.  Abdominal:     General: Abdomen is flat.     Palpations: Abdomen is soft.     Tenderness: There is no abdominal tenderness.  Musculoskeletal:        General: Tenderness (R upper lumbar/lower thoracic paraspinal muscle spasm) present. No swelling. Normal range of motion.     Cervical back: Neck supple.  Skin:    General: Skin is warm and dry.  Neurological:     General: No focal deficit present.     Mental Status: She is alert.  Psychiatric:        Mood and Affect: Mood normal.     ED Results / Procedures / Treatments   EKG None  Procedures Procedures  Medications Ordered in the ED Medications  cephALEXin  (KEFLEX ) capsule 500 mg (has no administration in time range)  ketorolac  (TORADOL ) 30 MG/ML injection 30 mg (30 mg Intramuscular Given 11/25/24 0602)    Initial Impression and Plan  Patient here with R sided back pain, tender to palpation. Most consistent  with a MSK pain. Will give IM toradol . Check UA at her request but denies urinary symptoms.   ED Course   Clinical Course as of 11/25/24 0642  Thu Nov 25, 2024  0635 UA equivocal for UTI, not having dysuria. Will treat empirically and send for culture.  [CS]    Clinical Course User Index [CS] Roselyn Carlin NOVAK, MD     MDM Rules/Calculators/A&P Medical Decision Making Problems Addressed: Acute cystitis without hematuria: acute illness or injury Acute right-sided thoracic back pain: acute illness or injury  Amount and/or Complexity of Data Reviewed Labs: ordered. Decision-making details documented in ED Course.  Risk Prescription drug management.     Final Clinical Impression(s) / ED Diagnoses Final diagnoses:  Acute right-sided thoracic back pain  Acute cystitis without  hematuria    Rx / DC Orders ED Discharge Orders          Ordered    cephALEXin  (KEFLEX ) 500 MG capsule  2 times daily        11/25/24 0641    naproxen  (NAPROSYN ) 500 MG tablet  2 times daily        11/25/24 0641    methocarbamol  (ROBAXIN ) 500 MG tablet  2 times daily        11/25/24 0641             Roselyn Carlin NOVAK, MD 11/25/24 [phone]  "

## 2024-11-26 LAB — URINE CULTURE

## 2025-07-20 ENCOUNTER — Ambulatory Visit: Admitting: Physician Assistant
